# Patient Record
Sex: Male | Born: 1945 | Race: White | Hispanic: No | Marital: Single | Smoking: Former smoker
Health system: Southern US, Community
[De-identification: ages and names within clinical notes are randomized; demographics above are authoritative.]

## PROBLEM LIST (undated history)

## (undated) DIAGNOSIS — Z9289 Personal history of other medical treatment: Secondary | ICD-10-CM

## (undated) DIAGNOSIS — I10 Essential (primary) hypertension: Secondary | ICD-10-CM

## (undated) DIAGNOSIS — E785 Hyperlipidemia, unspecified: Secondary | ICD-10-CM

## (undated) DIAGNOSIS — Q248 Other specified congenital malformations of heart: Secondary | ICD-10-CM

## (undated) DIAGNOSIS — I4821 Permanent atrial fibrillation: Secondary | ICD-10-CM

## (undated) HISTORY — PX: OTHER SURGICAL HISTORY: SHX169

## (undated) HISTORY — DX: Hyperlipidemia, unspecified: E78.5

## (undated) HISTORY — DX: Essential (primary) hypertension: I10

## (undated) HISTORY — DX: Permanent atrial fibrillation: I48.21

## (undated) HISTORY — DX: Personal history of other medical treatment: Z92.89

## (undated) HISTORY — DX: Other specified congenital malformations of heart: Q24.8

---

## 2005-01-26 ENCOUNTER — Observation Stay (HOSPITAL_COMMUNITY): Admission: EM | Admit: 2005-01-26 | Discharge: 2005-01-28 | Payer: Self-pay | Admitting: Emergency Medicine

## 2010-06-03 ENCOUNTER — Ambulatory Visit (INDEPENDENT_AMBULATORY_CARE_PROVIDER_SITE_OTHER): Payer: Medicare Other | Admitting: Cardiovascular Disease

## 2010-06-03 DIAGNOSIS — E7800 Pure hypercholesterolemia, unspecified: Secondary | ICD-10-CM

## 2010-06-03 DIAGNOSIS — I119 Hypertensive heart disease without heart failure: Secondary | ICD-10-CM

## 2010-07-28 ENCOUNTER — Other Ambulatory Visit: Payer: Self-pay | Admitting: *Deleted

## 2010-07-28 DIAGNOSIS — I10 Essential (primary) hypertension: Secondary | ICD-10-CM

## 2010-07-28 MED ORDER — HYDROCHLOROTHIAZIDE 25 MG PO TABS: 25.0000 mg | ORAL_TABLET | Freq: Every day | ORAL | Status: DC

## 2010-07-28 MED ORDER — POTASSIUM CHLORIDE ER 10 MEQ PO TBCR: 10.0000 meq | EXTENDED_RELEASE_TABLET | Freq: Every day | ORAL | Status: DC

## 2010-07-28 NOTE — Telephone Encounter (Signed)
escribe medication per fax request  

## 2010-08-18 ENCOUNTER — Other Ambulatory Visit: Payer: Self-pay | Admitting: *Deleted

## 2010-08-18 DIAGNOSIS — E7800 Pure hypercholesterolemia, unspecified: Secondary | ICD-10-CM

## 2010-08-26 ENCOUNTER — Encounter: Payer: Self-pay | Admitting: Cardiovascular Disease

## 2010-08-26 DIAGNOSIS — I10 Essential (primary) hypertension: Secondary | ICD-10-CM | POA: Insufficient documentation

## 2010-08-26 DIAGNOSIS — Q248 Other specified congenital malformations of heart: Secondary | ICD-10-CM | POA: Insufficient documentation

## 2010-08-26 DIAGNOSIS — E785 Hyperlipidemia, unspecified: Secondary | ICD-10-CM | POA: Insufficient documentation

## 2010-09-01 ENCOUNTER — Other Ambulatory Visit (INDEPENDENT_AMBULATORY_CARE_PROVIDER_SITE_OTHER): Payer: Medicare Other | Admitting: *Deleted

## 2010-09-01 ENCOUNTER — Encounter: Payer: Self-pay | Admitting: Cardiovascular Disease

## 2010-09-01 ENCOUNTER — Ambulatory Visit (INDEPENDENT_AMBULATORY_CARE_PROVIDER_SITE_OTHER): Payer: Medicare Other | Admitting: Cardiovascular Disease

## 2010-09-01 DIAGNOSIS — Z0000 Encounter for general adult medical examination without abnormal findings: Secondary | ICD-10-CM

## 2010-09-01 DIAGNOSIS — E7800 Pure hypercholesterolemia, unspecified: Secondary | ICD-10-CM

## 2010-09-01 DIAGNOSIS — E785 Hyperlipidemia, unspecified: Secondary | ICD-10-CM

## 2010-09-01 DIAGNOSIS — I10 Essential (primary) hypertension: Secondary | ICD-10-CM

## 2010-09-01 NOTE — Assessment & Plan Note (Signed)
He has not tolerated Lipator or Crestor.  Will wait and not try any additional meds at this point.  May be able to take Livalo.  Will check lipid levels again in 6 months.

## 2010-09-01 NOTE — Assessment & Plan Note (Signed)
His BP is fairly well controlled.  His BP is typically well controlled at home and is elevated when he goes to the doctor.  Will continue to watch his salt intake.  Continue to exercise.  Will see in 6 months

## 2010-09-01 NOTE — Progress Notes (Addendum)
Jeffrey Hicks Date of Birth  [birth year] [facility] [address]     [address] [place], [place]  [address] [phone]  Fax  [phone]  History of Present Illness:  Pt is doing well.  No chest pain or dyspnea.  Did not tolerate the Lipator- caused foot pain.  He stopped the lipator and the foot pain has mostly improved.  He still has some discomfort.  [place] in [place], [place].    Current Outpatient Prescriptions on File Prior to Visit  Medication Sig Dispense Refill  . aspirin 81 MG tablet Take 81 mg by mouth daily. 2 DAILY       . hydrochlorothiazide 25 MG tablet Take 1 tablet (25 mg total) by mouth daily.  30 tablet  5  . Multiple Vitamin (MULTIVITAMIN) tablet Take 1 tablet by mouth daily.        . Nebivolol HCl (BYSTOLIC) 20 MG TABS Take by mouth daily.        . potassium chloride (K-DUR) 10 MEQ tablet Take 1 tablet (10 mEq total) by mouth daily.  30 tablet  5  . DISCONTD: atorvastatin (LIPITOR) 20 MG tablet Take 20 mg by mouth daily.          Allergies  Allergen Reactions  . Crestor (Rosuvastatin Calcium)     LEG PAIN  . Lipitor (Atorvastatin Calcium) Other (See Comments)    Joint pain, r/ great toe    Past Medical History  Diagnosis Date  . Left ventricular outflow tract obstruction   . Hyperlipidemia   . Hypertension     No past surgical history on file.  History  Smoking status  . Former Smoker  . Quit date: 08/25/2000  Smokeless tobacco  . Not on file    History  Alcohol Use No    Family History  Problem Relation Age of Onset  . Stroke Mother   . Transient ischemic attack Father     Reviw of Systems:  Reviewed in the HPI.  All other systems are negative.  Physical Exam: BP 140/70  Pulse 64  Wt 205 lb (92.987 kg) The patient is alert and oriented x 3.  The mood and affect are normal.  The skin is warm and dry.  Color is normal.  The HEENT exam reveals that the sclera are  nonicteric.  The mucous membranes are moist.  The carotids are 2+ without bruits.  There is no thyromegaly.  There is no JVD.  The lungs are clear.  The chest wall is non tender.  The heart exam reveals a regular rate with a normal S1 and S2.  There is a soft systolic murmur.  The PMI is not displaced.   Abdominal exam reveals good bowel sounds.  There is no guarding or rebound.  There is no hepatosplenomegaly or tenderness.  There are no masses.  Exam of the legs reveal no clubbing, cyanosis, or edema.  The legs are without rashes.  The distal pulses are intact.  Cranial nerves II - XII are intact.  Motor and sensory functions are intact.  The gait is normal.  ECG: His ECG reveals sinus bradycardia with LVH.  Assessment / Plan:

## 2010-09-24 ENCOUNTER — Other Ambulatory Visit: Payer: Self-pay | Admitting: *Deleted

## 2010-09-24 DIAGNOSIS — I10 Essential (primary) hypertension: Secondary | ICD-10-CM

## 2010-09-24 MED ORDER — POTASSIUM CHLORIDE ER 10 MEQ PO TBCR: 10.0000 meq | EXTENDED_RELEASE_TABLET | Freq: Every day | ORAL | Status: DC

## 2010-09-24 MED ORDER — HYDROCHLOROTHIAZIDE 25 MG PO TABS: 25.0000 mg | ORAL_TABLET | Freq: Every day | ORAL | Status: DC

## 2010-09-24 NOTE — Telephone Encounter (Signed)
Fax received from pharmacy. Refill completed. 90 day fill, Alfonso Ramus RN

## 2011-04-08 ENCOUNTER — Other Ambulatory Visit: Payer: Self-pay | Admitting: *Deleted

## 2011-04-08 DIAGNOSIS — I10 Essential (primary) hypertension: Secondary | ICD-10-CM

## 2011-04-08 MED ORDER — HYDROCHLOROTHIAZIDE 25 MG PO TABS: 25.0000 mg | ORAL_TABLET | Freq: Every day | ORAL | Status: DC

## 2011-05-13 ENCOUNTER — Other Ambulatory Visit: Payer: Self-pay | Admitting: *Deleted

## 2011-05-13 DIAGNOSIS — I10 Essential (primary) hypertension: Secondary | ICD-10-CM

## 2011-05-13 MED ORDER — POTASSIUM CHLORIDE ER 10 MEQ PO TBCR: 10.0000 meq | EXTENDED_RELEASE_TABLET | Freq: Every day | ORAL | Status: DC

## 2011-06-22 ENCOUNTER — Other Ambulatory Visit: Payer: Self-pay | Admitting: *Deleted

## 2011-06-22 ENCOUNTER — Other Ambulatory Visit (HOSPITAL_COMMUNITY): Payer: Self-pay | Admitting: Family Medicine

## 2011-06-22 DIAGNOSIS — I10 Essential (primary) hypertension: Secondary | ICD-10-CM

## 2011-06-22 DIAGNOSIS — E785 Hyperlipidemia, unspecified: Secondary | ICD-10-CM

## 2011-06-22 MED ORDER — NEBIVOLOL HCL 20 MG PO TABS: 20.0000 mg | ORAL_TABLET | Freq: Every day | ORAL | Status: DC

## 2011-06-23 ENCOUNTER — Ambulatory Visit (HOSPITAL_COMMUNITY): Payer: Medicare Other

## 2011-06-23 ENCOUNTER — Other Ambulatory Visit: Payer: Self-pay | Admitting: *Deleted

## 2011-07-22 ENCOUNTER — Ambulatory Visit (INDEPENDENT_AMBULATORY_CARE_PROVIDER_SITE_OTHER): Payer: Medicare Other | Admitting: Cardiovascular Disease

## 2011-07-22 ENCOUNTER — Encounter: Payer: Self-pay | Admitting: Cardiovascular Disease

## 2011-07-22 VITALS — BP 158/76 | HR 60 | Ht 70.0 in | Wt 208.1 lb

## 2011-07-22 DIAGNOSIS — I10 Essential (primary) hypertension: Secondary | ICD-10-CM

## 2011-07-22 DIAGNOSIS — E785 Hyperlipidemia, unspecified: Secondary | ICD-10-CM

## 2011-07-22 MED ORDER — LISINOPRIL 10 MG PO TABS: 10.0000 mg | ORAL_TABLET | Freq: Every day | ORAL | Status: DC

## 2011-07-22 NOTE — Assessment & Plan Note (Signed)
He still has an elevated blood pressure. He has not tolerated atorvastatin or Crestor. We will consider starting him on Vytorin in the near future. We'll check fasting labs on him in 3 months.

## 2011-07-22 NOTE — Patient Instructions (Addendum)
Your physician recommends that you schedule a follow-up appointment in:3 MONTHS   Your physician recommends that you return for a FASTING lipid profile: 3 MONTHS   Your physician has recommended you make the following change in your medication  START LISINOPRIL 10 MG DAILY  DASH Diet The DASH diet stands for "Dietary Approaches to Stop Hypertension." It is a healthy eating plan that has been shown to reduce high blood pressure (hypertension) in as little as 14 days, while also possibly providing other significant health benefits. These other health benefits include reducing the risk of breast cancer after menopause and reducing the risk of type 2 diabetes, heart disease, colon cancer, and stroke. Health benefits also include weight loss and slowing kidney failure in patients with chronic kidney disease.  DIET GUIDELINES  Limit salt (sodium). Your diet should contain less than 1500 mg of sodium daily.   Limit refined or processed carbohydrates. Your diet should include mostly whole grains. Desserts and added sugars should be used sparingly.   Include small amounts of heart-healthy fats. These types of fats include nuts, oils, and tub margarine. Limit saturated and trans fats. These fats have been shown to be harmful in the body.  CHOOSING FOODS  The following food groups are based on a 2000 calorie diet. See your Registered Dietitian for individual calorie needs. Grains and Grain Products (6 to 8 servings daily)  Eat More Often: Whole-wheat bread, brown rice, whole-grain or wheat pasta, quinoa, popcorn without added fat or salt (air popped).   Eat Less Often: White bread, white pasta, white rice, cornbread.  Vegetables (4 to 5 servings daily)  Eat More Often: Fresh, frozen, and canned vegetables. Vegetables may be raw, steamed, roasted, or grilled with a minimal amount of fat.   Eat Less Often/Avoid: Creamed or fried vegetables. Vegetables in a cheese sauce.  Fruit (4 to 5 servings  daily)  Eat More Often: All fresh, canned (in natural juice), or frozen fruits. Dried fruits without added sugar. One hundred percent fruit juice ( cup [237 mL] daily).   Eat Less Often: Dried fruits with added sugar. Canned fruit in light or heavy syrup.  [employer]ocker, Fish, and Poultry (2 servings or less daily. One serving is 3 to 4 oz [85-114 g]).  Eat More Often: Ninety percent or leaner ground beef, tenderloin, sirloin. Round cuts of beef, chicken breast, [place] breast. All fish. Grill, bake, or broil your meat. Nothing should be fried.   Eat Less Often/Avoid: Fatty cuts of meat, [place], or chicken leg, thigh, or wing. Fried cuts of meat or fish.  Dairy (2 to 3 servings)  Eat More Often: Low-fat or fat-free milk, low-fat plain or light yogurt, reduced-fat or part-skim cheese.   Eat Less Often/Avoid: Milk (whole, 2%, skim, or chocolate).Whole milk yogurt. Full-fat cheeses.  Nuts, Seeds, and Legumes (4 to 5 servings per week)  Eat More Often: All without added salt.   Eat Less Often/Avoid: Salted nuts and seeds, canned beans with added salt.  Fats and Sweets (limited)  Eat More Often: Vegetable oils, tub margarines without trans fats, sugar-free gelatin. Mayonnaise and salad dressings.   Eat Less Often/Avoid: Coconut oils, palm oils, butter, stick margarine, cream, half and half, cookies, candy, pie.  FOR MORE INFORMATION The Dash Diet Eating Plan: www.dashdiet.org Document Released: 03/26/2011 Document Reviewed: 03/16/2011 [facility] Patient Information 2012 [place], [place].

## 2011-07-22 NOTE — Progress Notes (Signed)
Jeffrey Hicks Date of Birth  1945/10/05 [facility] / [facility] [address]     [address] [place], [place]  [address] [phone]  Fax  [phone]  History of Present Illness:  Pt is doing well.  No chest pain or dyspnea.  Did not tolerate the Lipator- caused foot pain.  He stopped the lipator and the foot pain has mostly improved.  He still has some discomfort.  He has been up to his mountain house in [place], [place].    Current Outpatient Prescriptions on File Prior to Visit  Medication Sig Dispense Refill  . aspirin 81 MG tablet Take 81 mg by mouth daily. 2 DAILY       . hydrochlorothiazide (HYDRODIURIL) 25 MG tablet Take 1 tablet (25 mg total) by mouth daily.  90 tablet  1  . Multiple Vitamin (MULTIVITAMIN) tablet Take 1 tablet by mouth daily.        . Nebivolol HCl (BYSTOLIC) 20 MG TABS Take 1 tablet (20 mg total) by mouth daily.  30 tablet  6  . potassium chloride (K-DUR) 10 MEQ tablet Take 1 tablet (10 mEq total) by mouth daily.  90 tablet  2    Allergies  Allergen Reactions  . Crestor (Rosuvastatin Calcium)     LEG PAIN  . Lipitor (Atorvastatin Calcium) Other (See Comments)    Joint pain, r/ great toe    Past Medical History  Diagnosis Date  . Left ventricular outflow tract obstruction   . Hyperlipidemia   . Hypertension     No past surgical history on file.  History  Smoking status  . Former Smoker  . Quit date: 08/25/2000  Smokeless tobacco  . Not on file    History  Alcohol Use No    Family History  Problem Relation Age of Onset  . Stroke Mother   . Transient ischemic attack Father     Reviw of Systems:  Reviewed in the HPI.  All other systems are negative.  Physical Exam: BP 158/76  Pulse 60  Ht 5\' 10"  (1.778 m)  Wt 208 lb 1.9 oz (94.403 kg)  BMI 29.86 kg/m2 The patient is alert and oriented x 3.  The mood and affect are normal.  The skin is warm and dry.  Color is normal.  The HEENT exam reveals that the  sclera are nonicteric.  The mucous membranes are moist.  The carotids are 2+ without bruits.  There is no thyromegaly.  There is no JVD.  The lungs are clear.  The chest wall is non tender.  The heart exam reveals a regular rate with a normal S1 and S2.  There is a 2/6  systolic murmur.  The PMI is not displaced.   Abdominal exam reveals good bowel sounds.  There is no guarding or rebound.  There is no hepatosplenomegaly or tenderness.  There are no masses.  Exam of the legs reveal no clubbing, cyanosis, or edema.  The legs are without rashes.  The distal pulses are intact.  Cranial nerves II - XII are intact.  Motor and sensory functions are intact.  The gait is normal.  ECG: 07/22/2011. Normal sinus rhythm. He has nonspecific ST and T-wave adenopathies.  Assessment / Plan:

## 2011-07-22 NOTE — Assessment & Plan Note (Signed)
Jeffrey Hicks's blood pressure still elevated. We will add lisinopril 10 mg a day to his medical regimen. I've asked him to try to watch his salt intake.

## 2011-10-14 ENCOUNTER — Ambulatory Visit: Payer: Medicare Other | Admitting: Cardiovascular Disease

## 2011-10-15 ENCOUNTER — Ambulatory Visit (INDEPENDENT_AMBULATORY_CARE_PROVIDER_SITE_OTHER): Payer: Medicare Other | Admitting: Cardiovascular Disease

## 2011-10-15 ENCOUNTER — Encounter: Payer: Self-pay | Admitting: Cardiovascular Disease

## 2011-10-15 VITALS — BP 126/70 | HR 50 | Ht 70.0 in | Wt 194.0 lb

## 2011-10-15 DIAGNOSIS — I10 Essential (primary) hypertension: Secondary | ICD-10-CM

## 2011-10-15 DIAGNOSIS — E785 Hyperlipidemia, unspecified: Secondary | ICD-10-CM

## 2011-10-15 LAB — HEPATIC FUNCTION PANEL
ALT: 20 U/L (ref 0–53)
Albumin: 3.9 g/dL (ref 3.5–5.2)
Alkaline Phosphatase: 46 U/L (ref 39–117)
Bilirubin, Direct: 0.1 mg/dL (ref 0.0–0.3)
Total Bilirubin: 0.8 mg/dL (ref 0.3–1.2)
Total Protein: 6.8 g/dL (ref 6.0–8.3)

## 2011-10-15 LAB — BASIC METABOLIC PANEL
BUN: 18 mg/dL (ref 6–23)
CO2: 29 mEq/L (ref 19–32)
Calcium: 9.4 mg/dL (ref 8.4–10.5)
Creatinine, Ser: 1.2 mg/dL (ref 0.4–1.5)
GFR: 64.24 mL/min (ref >60.00)
Glucose, Bld: 104 mg/dL — ABNORMAL HIGH (ref 70–99)
Potassium: 4.3 mEq/L (ref 3.5–5.1)
Sodium: 140 mEq/L (ref 135–145)

## 2011-10-15 LAB — LIPID PANEL
Cholesterol: 223 mg/dL — ABNORMAL HIGH (ref 0–200)
HDL: 67.4 mg/dL (ref >39.00)
Total CHOL/HDL Ratio: 3
Triglycerides: 67 mg/dL (ref 0.0–149.0)

## 2011-10-15 NOTE — Progress Notes (Signed)
    [name] Date of Birth  [birth year]       [facility] Office [address]  [address] [place], [place]  [address]   [place], [place]  [address] [phone]     [phone]   Fax  [phone]    Fax [phone]  Problem List: 1. Hyperlipidemia 2. Hypertension   History of Present Illness: Pt is doing well. No chest pain or dyspnea. Did not tolerate the Lipator- caused foot pain. He stopped the lipator and the foot pain has mostly improved. He still has some discomfort.  He has been up to his mountain house in [place], [place].   He's been doing lots of yard work. He has not had episodes of chest pain or shortness of breath.  Current Outpatient Prescriptions on File Prior to Visit  Medication Sig Dispense Refill  . aspirin 81 MG tablet Take 81 mg by mouth daily. 2 Tablets      . hydrochlorothiazide (HYDRODIURIL) 25 MG tablet Take 1 tablet (25 mg total) by mouth daily.  90 tablet  1  . lisinopril (PRINIVIL,ZESTRIL) 10 MG tablet Take 1 tablet (10 mg total) by mouth daily.  30 tablet  5  . Multiple Vitamin (MULTIVITAMIN) tablet Take 1 tablet by mouth daily.        . Nebivolol HCl (BYSTOLIC) 20 MG TABS Take 1 tablet (20 mg total) by mouth daily.  30 tablet  6  . potassium chloride (K-DUR) 10 MEQ tablet Take 1 tablet (10 mEq total) by mouth daily.  90 tablet  2    Allergies  Allergen Reactions  . Crestor (Rosuvastatin Calcium)     LEG PAIN  . Lipitor (Atorvastatin Calcium) Other (See Comments)    Joint pain, r/ great toe    Past Medical History  Diagnosis Date  . Left ventricular outflow tract obstruction   . Hyperlipidemia   . Hypertension     No past surgical history on file.  History  Smoking status  . Former Smoker  . Quit date: 08/25/2000  Smokeless tobacco  . Not on file    History  Alcohol Use No    Family History  Problem Relation Age of Onset  . Stroke Mother   . Transient ischemic attack  Father     Reviw of Systems:  Reviewed in the HPI.  All other systems are negative.  Physical Exam: Blood pressure 126/70, pulse 50, height 5\' 10"  (1.778 m), weight 194 lb (87.998 kg). General: Well developed, well nourished, in no acute distress.  Head: Normocephalic, atraumatic, sclera non-icteric, mucus membranes are moist,   Neck: Supple. Carotids are 2 + without bruits. No JVD  Lungs: Clear bilaterally to auscultation.  Heart: regular rate.  normal  S1 S2. No murmurs, gallops or rubs.  Abdomen: Soft, non-tender, non-distended with normal bowel sounds. No hepatomegaly. No rebound/guarding. No masses.  Msk:  Strength and tone are normal  Extremities: No clubbing or cyanosis. No edema.  Distal pedal pulses are 2+ and equal bilaterally.  Neuro: Alert and oriented X 3. Moves all extremities spontaneously.  Psych:  Responds to questions appropriately with a normal affect.  ECG:  Assessment / Plan:

## 2011-10-15 NOTE — Assessment & Plan Note (Signed)
He has had significant leg pain on atorvastatin. He has already stopped it. He checked his labs today and his LDL is mildly elevated.  At this point I'm not sure that we need to have any additional medications. We'll see him back in 6 months. We'll check fasting lipids at that time.

## 2011-10-15 NOTE — Patient Instructions (Addendum)
Your physician recommends that you return for lab work in: today and in 6 months   Your physician wants you to follow-up in: 6 months  You will receive a reminder letter in the mail two months in advance. If you don't receive a letter, please call our office to schedule the follow-up appointment.

## 2011-10-15 NOTE — Assessment & Plan Note (Signed)
His blood pressures been well-controlled. We'll continue the same medications.

## 2011-12-28 ENCOUNTER — Other Ambulatory Visit: Payer: Self-pay | Admitting: *Deleted

## 2011-12-28 MED ORDER — LISINOPRIL 10 MG PO TABS: 10.0000 mg | ORAL_TABLET | Freq: Every day | ORAL | Status: DC

## 2011-12-28 NOTE — Telephone Encounter (Signed)
Fax Received. Refill Completed. Jeffrey Hicks (R.M.A)   

## 2012-02-19 ENCOUNTER — Other Ambulatory Visit: Payer: Self-pay | Admitting: *Deleted

## 2012-02-19 ENCOUNTER — Other Ambulatory Visit: Payer: Self-pay | Admitting: Cardiovascular Disease

## 2012-02-19 DIAGNOSIS — I10 Essential (primary) hypertension: Secondary | ICD-10-CM

## 2012-02-19 MED ORDER — HYDROCHLOROTHIAZIDE 25 MG PO TABS: 25.0000 mg | ORAL_TABLET | Freq: Every day | ORAL | Status: DC

## 2012-02-19 MED ORDER — POTASSIUM CHLORIDE ER 10 MEQ PO TBCR: 10.0000 meq | EXTENDED_RELEASE_TABLET | Freq: Every day | ORAL | Status: DC

## 2012-02-19 NOTE — Telephone Encounter (Signed)
Fax Received. Refill Completed. Deeandra Jerry Chowoe (R.M.A)   

## 2012-02-19 NOTE — Telephone Encounter (Signed)
Fax Received. Refill Completed. Jeffrey Hicks (R.M.A)   

## 2012-03-30 ENCOUNTER — Other Ambulatory Visit: Payer: Self-pay | Admitting: *Deleted

## 2012-03-30 DIAGNOSIS — I10 Essential (primary) hypertension: Secondary | ICD-10-CM

## 2012-03-30 DIAGNOSIS — E785 Hyperlipidemia, unspecified: Secondary | ICD-10-CM

## 2012-03-31 ENCOUNTER — Encounter: Payer: Self-pay | Admitting: Cardiovascular Disease

## 2012-03-31 ENCOUNTER — Ambulatory Visit (INDEPENDENT_AMBULATORY_CARE_PROVIDER_SITE_OTHER): Payer: Medicare Other | Admitting: Cardiovascular Disease

## 2012-03-31 ENCOUNTER — Other Ambulatory Visit (INDEPENDENT_AMBULATORY_CARE_PROVIDER_SITE_OTHER): Payer: Medicare Other

## 2012-03-31 VITALS — BP 168/80 | HR 55 | Ht 70.0 in | Wt 184.1 lb

## 2012-03-31 DIAGNOSIS — E785 Hyperlipidemia, unspecified: Secondary | ICD-10-CM

## 2012-03-31 DIAGNOSIS — I10 Essential (primary) hypertension: Secondary | ICD-10-CM

## 2012-03-31 LAB — BASIC METABOLIC PANEL
BUN: 21 mg/dL (ref 6–23)
CO2: 29 mEq/L (ref 19–32)
Calcium: 9 mg/dL (ref 8.4–10.5)
Chloride: 100 mEq/L (ref 96–112)
Creatinine, Ser: 1.2 mg/dL (ref 0.4–1.5)
Glucose, Bld: 110 mg/dL — ABNORMAL HIGH (ref 70–99)
Potassium: 4.2 mEq/L (ref 3.5–5.1)

## 2012-03-31 LAB — LIPID PANEL
Cholesterol: 187 mg/dL (ref 0–200)
HDL: 64 mg/dL (ref >39.00)
LDL Cholesterol: 112 mg/dL — ABNORMAL HIGH (ref 0–99)
Total CHOL/HDL Ratio: 3
Triglycerides: 54 mg/dL (ref 0.0–149.0)
VLDL: 10.8 mg/dL (ref 0.0–40.0)

## 2012-03-31 MED ORDER — POTASSIUM CHLORIDE ER 10 MEQ PO TBCR: 10.0000 meq | EXTENDED_RELEASE_TABLET | Freq: Every day | ORAL | Status: DC

## 2012-03-31 MED ORDER — HYDROCHLOROTHIAZIDE 25 MG PO TABS: 25.0000 mg | ORAL_TABLET | Freq: Every day | ORAL | Status: DC

## 2012-03-31 MED ORDER — LISINOPRIL 10 MG PO TABS: 10.0000 mg | ORAL_TABLET | Freq: Every day | ORAL | Status: DC

## 2012-03-31 MED ORDER — NEBIVOLOL HCL 20 MG PO TABS: 20.0000 mg | ORAL_TABLET | Freq: Every day | ORAL | Status: DC

## 2012-03-31 NOTE — Progress Notes (Signed)
    Jeffrey Hicks Date of Birth  1946-01-18       [facility] Office [address]  [address] [place], [place]  [address]   [place], [place]  [address] [phone]     [phone]   Fax  [phone]    Fax [phone]  Problem List: 1. Hyperlipidemia 2. Hypertension   History of Present Illness: Pt is doing well. No chest pain or dyspnea. Did not tolerate the Lipator- caused foot pain. He stopped the lipator and the foot pain has mostly improved. He still has some discomfort.  He has been up to his mountain house in [place], [place].   He's been doing lots of yard work. He has not had episodes of chest pain or shortness of breath.  His BP is up a bit today.  His blood pressure readings at home have been normal.  Current Outpatient Prescriptions on File Prior to Visit  Medication Sig Dispense Refill  . aspirin 81 MG tablet Take 81 mg by mouth daily. 2 Tablets      . hydrochlorothiazide (HYDRODIURIL) 25 MG tablet Take 1 tablet (25 mg total) by mouth daily.  90 tablet  1  . lisinopril (PRINIVIL,ZESTRIL) 10 MG tablet Take 1 tablet (10 mg total) by mouth daily.  30 tablet  5  . Multiple Vitamin (MULTIVITAMIN) tablet Take 1 tablet by mouth daily.        . Nebivolol HCl (BYSTOLIC) 20 MG TABS Take 1 tablet (20 mg total) by mouth daily.  30 tablet  6  . potassium chloride (K-DUR) 10 MEQ tablet Take 1 tablet (10 mEq total) by mouth daily.  90 tablet  2    Allergies  Allergen Reactions  . Crestor (Rosuvastatin Calcium)     LEG PAIN  . Lipitor (Atorvastatin Calcium) Other (See Comments)    Joint pain, r/ great toe    Past Medical History  Diagnosis Date  . Left ventricular outflow tract obstruction   . Hyperlipidemia   . Hypertension     No past surgical history on file.  History  Smoking status  . Former Smoker  . Quit date: 08/25/2000  Smokeless tobacco  . Not on file    History  Alcohol Use No    Family History   Problem Relation Age of Onset  . Stroke Mother   . Transient ischemic attack Father     Reviw of Systems:  Reviewed in the HPI.  All other systems are negative.  Physical Exam: Blood pressure 168/80, pulse 55, height 5\' 10"  (1.778 m), weight 184 lb 1.9 oz (83.516 kg), SpO2 98.00%. General: Well developed, well nourished, in no acute distress.  Head: Normocephalic, atraumatic, sclera non-icteric, mucus membranes are moist,   Neck: Supple. Carotids are 2 + without bruits. No JVD  Lungs: Clear bilaterally to auscultation.  Heart: regular rate.  normal  S1 S2. No murmurs, gallops or rubs.  Abdomen: Soft, non-tender, non-distended with normal bowel sounds. No hepatomegaly. No rebound/guarding. No masses.  Msk:  Strength and tone are normal  Extremities: No clubbing or cyanosis. No edema.  Distal pedal pulses are 2+ and equal bilaterally.  Neuro: Alert and oriented X 3. Moves all extremities spontaneously.  Psych:  Responds to questions appropriately with a normal affect.  ECG:  Assessment / Plan:

## 2012-03-31 NOTE — Patient Instructions (Addendum)
Your physician wants you to follow-up in: 6 months  You will receive a reminder letter in the mail two months in advance. If you don't receive a letter, please call our office to schedule the follow-up appointment.  Your physician recommends that you continue on your current medications as directed. Please refer to the Current Medication list given to you today.  

## 2012-03-31 NOTE — Assessment & Plan Note (Signed)
Jeffrey Hicks  presents today for further evaluation of his hypertension. His blood pressure is a little high today. It was normal during his last visit and he states that it is normal at home. We will continue with his same medications. He'll continue to exercise on a regular basis. I've instructed him to watch his salt.  He will bring his  blood pressure log with him during his next visit

## 2012-03-31 NOTE — Assessment & Plan Note (Signed)
His cholesterol levels have been a little bit high. His triglyceride levels are well controlled. He is intolerant to statins. We drew repeat fasting labs today.  I seen again in 6 months for followup office visit, fasting labs and EKG.

## 2012-07-27 ENCOUNTER — Other Ambulatory Visit: Payer: Self-pay | Admitting: *Deleted

## 2012-07-27 MED ORDER — LISINOPRIL 10 MG PO TABS: 10.0000 mg | ORAL_TABLET | Freq: Every day | ORAL | Status: DC

## 2012-11-03 ENCOUNTER — Ambulatory Visit (INDEPENDENT_AMBULATORY_CARE_PROVIDER_SITE_OTHER): Payer: Medicare Other | Admitting: Cardiovascular Disease

## 2012-11-03 ENCOUNTER — Encounter: Payer: Self-pay | Admitting: Cardiovascular Disease

## 2012-11-03 VITALS — BP 160/76 | HR 49 | Ht 70.0 in | Wt 185.8 lb

## 2012-11-03 DIAGNOSIS — I10 Essential (primary) hypertension: Secondary | ICD-10-CM

## 2012-11-03 DIAGNOSIS — E785 Hyperlipidemia, unspecified: Secondary | ICD-10-CM

## 2012-11-03 NOTE — Progress Notes (Signed)
Jeffrey Hicks Date of Birth  1945-06-04       [facility] Office [address]  [address] [place], [place]  [address]   [place], [place]  [address] [phone]     [phone]   Fax  [phone]    Fax [phone]  Problem List: 1. Hyperlipidemia 2. Hypertension   History of Present Illness: Jeffrey Hicks  is doing well. No chest pain or dyspnea. Did not tolerate the Lipator- caused foot pain. He stopped the lipator and the foot pain has mostly improved. He still has some discomfort.  He has been up to his mountain house in [place], [place].   He's been doing lots of yard work. He has not had episodes of chest pain or shortness of breath.  His BP is up a bit today.  His blood pressure readings at home have been normal.  November 03, 2012:  Jeffrey Hicks has been doing well.  He has spent lots of time in [place] .    He has been taking his BP regularly and his readings are in the 105 / 70 range.    His readings are always elevated here in the office.   Current Outpatient Prescriptions on File Prior to Visit  Medication Sig Dispense Refill  . aspirin 81 MG tablet Take 81 mg by mouth daily. 2 Tablets      . hydrochlorothiazide (HYDRODIURIL) 25 MG tablet Take 1 tablet (25 mg total) by mouth daily.  90 tablet  3  . lisinopril (PRINIVIL,ZESTRIL) 10 MG tablet Take 1 tablet (10 mg total) by mouth daily.  30 tablet  3  . Multiple Vitamin (MULTIVITAMIN) tablet Take 1 tablet by mouth daily.        . Nebivolol HCl (BYSTOLIC) 20 MG TABS Take 1 tablet (20 mg total) by mouth daily.  30 tablet  11  . potassium chloride (K-DUR) 10 MEQ tablet Take 1 tablet (10 mEq total) by mouth daily.  90 tablet  3   No current facility-administered medications on file prior to visit.    Allergies  Allergen Reactions  . Crestor (Rosuvastatin Calcium)     LEG PAIN  . Lipitor (Atorvastatin Calcium) Other (See Comments)    Joint pain, r/ great toe    Past Medical  History  Diagnosis Date  . Left ventricular outflow tract obstruction   . Hyperlipidemia   . Hypertension     No past surgical history on file.  History  Smoking status  . Former Smoker  . Quit date: 08/25/2000  Smokeless tobacco  . Not on file    History  Alcohol Use No    Family History  Problem Relation Age of Onset  . Stroke Mother   . Transient ischemic attack Father     Reviw of Systems:  Reviewed in the HPI.  All other systems are negative.  Physical Exam: Blood pressure 160/76, pulse 49, height 5\' 10"  (1.778 m), weight 185 lb 12.8 oz (84.278 kg). General: Well developed, well nourished, in no acute distress.  Head: Normocephalic, atraumatic, sclera non-icteric, mucus membranes are moist,   Neck: Supple. Carotids are 2 + without bruits. No JVD  Lungs: Clear bilaterally to auscultation.  Heart: regular rate.  normal  S1 S2. No murmurs, gallops or rubs.  Abdomen: Soft, non-tender, non-distended with normal bowel sounds. No hepatomegaly. No rebound/guarding. No masses.  Msk:  Strength and tone are normal  Extremities: No clubbing or cyanosis. No  edema.  Distal pedal pulses are 2+ and equal bilaterally.  Neuro: Alert and oriented X 3. Moves all extremities spontaneously.  Psych:  Responds to questions appropriately with a normal affect.  ECG: November 03, 2012:  Marked sinus bradycardia at 49.  Voltage for LVH.  Assessment / Plan:

## 2012-11-03 NOTE — Patient Instructions (Addendum)
Your physician wants you to follow-up in: 6 month You will receive a reminder letter in the mail two months in advance. If you don't receive a letter, please call our office to schedule the follow-up appointment.  Your physician recommends that you return for a FASTING lipid profile: 6 months   Your physician recommends that you continue on your current medications as directed. Please refer to the Current Medication list given to you today.

## 2012-11-03 NOTE — Assessment & Plan Note (Addendum)
Jeffrey Hicks is doing well.  His BP readings at home are always quite good.  The BP seems to be elevated when he comes to the office.  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   We will see him in 6 months.   He will bring his BP cuff with him so that we can calibrate it with ours

## 2013-04-04 ENCOUNTER — Other Ambulatory Visit: Payer: Self-pay

## 2013-04-04 MED ORDER — LISINOPRIL 10 MG PO TABS: 10.0000 mg | ORAL_TABLET | Freq: Every day | ORAL | Status: DC

## 2013-04-19 ENCOUNTER — Other Ambulatory Visit: Payer: Self-pay

## 2013-04-19 MED ORDER — NEBIVOLOL HCL 20 MG PO TABS: 20.0000 mg | ORAL_TABLET | Freq: Every day | ORAL | Status: DC

## 2013-04-21 ENCOUNTER — Other Ambulatory Visit: Payer: Self-pay

## 2013-04-21 MED ORDER — NEBIVOLOL HCL 20 MG PO TABS: 20.0000 mg | ORAL_TABLET | Freq: Every day | ORAL | Status: DC

## 2013-05-09 ENCOUNTER — Encounter: Payer: Self-pay | Admitting: Cardiovascular Disease

## 2013-05-09 ENCOUNTER — Ambulatory Visit (INDEPENDENT_AMBULATORY_CARE_PROVIDER_SITE_OTHER): Payer: Medicare Other | Admitting: Cardiovascular Disease

## 2013-05-09 VITALS — BP 155/67 | HR 52 | Ht 70.0 in | Wt 193.0 lb

## 2013-05-09 DIAGNOSIS — I10 Essential (primary) hypertension: Secondary | ICD-10-CM

## 2013-05-09 NOTE — Patient Instructions (Signed)
Your physician wants you to follow-up in: 6 months  You will receive a reminder letter in the mail two months in advance. If you don't receive a letter, please call our office to schedule the follow-up appointment.  Your physician recommends that you continue on your current medications as directed. Please refer to the Current Medication list given to you today.  

## 2013-05-09 NOTE — Progress Notes (Signed)
Jeffrey Hicks Date of Birth  04/16/1946       [facility]    [facility] [address], [address]  [address], [address] [place]  [address]   [place], [place]  [phone]     [phone]   Fax  [phone]    Fax [phone]  Problem List: 1. Hyperlipidemia 2. Hypertension   History of Present Illness: Jeffrey Hicks  is doing well. No chest pain or dyspnea. Did not tolerate the Lipator- caused foot pain. He stopped the lipator and the foot pain has mostly improved. He still has some discomfort.  [place], [place]   He's been doing lots of yard work. He has not had episodes of chest pain or shortness of breath.  His BP is up a bit today.  His blood pressure readings at home have been normal.  November 03, 2012:  Jeffrey Hicks has been doing well.  He has spent lots of time in [place] .    He has been taking his BP regularly and his readings are in the 105 / 70 range.    His readings are always elevated here in the office.   Jan. 20, 2015  Jeffrey Hicks is doing well.   He records his BP regularly at home and his readings are always normal.  He is exercising - walks 2-3 miles a day.   [place]    His lipids were drawn by his medical doctor. His total cholesterol is 222. His LDL is 139. The triglyceride level is  ow.  Current Outpatient Prescriptions on File Prior to Visit  Medication Sig Dispense Refill  . aspirin 81 MG tablet Take 81 mg by mouth daily. 2 Tablets      . lisinopril (PRINIVIL,ZESTRIL) 10 MG tablet Take 1 tablet (10 mg total) by mouth daily.  30 tablet  3  . Multiple Vitamin (MULTIVITAMIN) tablet Take 1 tablet by mouth daily.        . Nebivolol HCl (BYSTOLIC) 20 MG TABS Take 1 tablet (20 mg total) by mouth daily.  30 tablet  6  . Omega-3 Fatty Acids (FISH OIL PO) Take by mouth daily.      . hydrochlorothiazide (HYDRODIURIL) 25 MG tablet Take 1 tablet (25 mg total) by mouth daily.  90 tablet  3  .  potassium chloride (K-DUR) 10 MEQ tablet Take 1 tablet (10 mEq total) by mouth daily.  90 tablet  3   No current facility-administered medications on file prior to visit.    Allergies  Allergen Reactions  . Crestor [Rosuvastatin Calcium]     LEG PAIN  . Lipitor [Atorvastatin Calcium] Other (See Comments)    Joint pain, r/ great toe    Past Medical History  Diagnosis Date  . Left ventricular outflow tract obstruction   . Hyperlipidemia   . Hypertension     No past surgical history on file.  History  Smoking status  . Former Smoker  . Quit date: 08/25/2000  Smokeless tobacco  . Not on file    History  Alcohol Use No    Family History  Problem Relation Age of Onset  . Stroke Mother   . Transient ischemic attack Father     Reviw of Systems:  Reviewed in the HPI.  All other systems are negative.  Physical Exam: Blood pressure 155/67, pulse 52, height 5\' 10"  (1.778 m), weight 193 lb (87.544 kg). General:  Well developed, well nourished, in no acute distress.  Head: Normocephalic, atraumatic, sclera non-icteric, mucus membranes are moist,   Neck: Supple. Carotids are 2 + without bruits. No JVD  Lungs: Clear bilaterally to auscultation.  Heart: regular rate.  normal  S1 S2. No murmurs, gallops or rubs.  Abdomen: Soft, non-tender, non-distended with normal bowel sounds. No hepatomegaly. No rebound/guarding. No masses.  Msk:  Strength and tone are normal  Extremities: No clubbing or cyanosis. No edema.  Distal pedal pulses are 2+ and equal bilaterally.  Neuro: Alert and oriented X 3. Moves all extremities spontaneously.  Psych:  Responds to questions appropriately with a normal affect.  ECG: November 03, 2012:  Marked sinus bradycardia at 48.  Voltage for LVH.  Assessment / Plan:

## 2013-05-09 NOTE — Assessment & Plan Note (Signed)
Jeffrey MarshallJimmy  is doing fine. His blood pressures little bit elevated here today but his blood pressure at home are in the normal range.  I have reminded him to keep a BP log and to bring his readings in with him at his next visit.  He remains active.  Does not eat salt.   Will see him in 6 months.

## 2013-05-29 ENCOUNTER — Other Ambulatory Visit: Payer: Self-pay

## 2013-05-29 DIAGNOSIS — I10 Essential (primary) hypertension: Secondary | ICD-10-CM

## 2013-05-29 MED ORDER — HYDROCHLOROTHIAZIDE 25 MG PO TABS
25.0000 mg | ORAL_TABLET | Freq: Every day | ORAL | Status: DC
Start: 2013-05-29 — End: 2014-07-03

## 2013-05-29 MED ORDER — POTASSIUM CHLORIDE ER 10 MEQ PO TBCR: 10.0000 meq | EXTENDED_RELEASE_TABLET | Freq: Every day | ORAL | Status: DC

## 2013-07-07 ENCOUNTER — Other Ambulatory Visit: Payer: Self-pay | Admitting: *Deleted

## 2013-07-07 MED ORDER — LISINOPRIL 10 MG PO TABS: 10.0000 mg | ORAL_TABLET | Freq: Every day | ORAL | Status: DC

## 2013-10-18 DEATH — deceased

## 2013-11-10 ENCOUNTER — Other Ambulatory Visit: Payer: Self-pay

## 2013-11-10 MED ORDER — LISINOPRIL 10 MG PO TABS: 10.0000 mg | ORAL_TABLET | Freq: Every day | ORAL | Status: DC

## 2014-03-02 ENCOUNTER — Other Ambulatory Visit: Payer: Self-pay | Admitting: *Deleted

## 2014-03-02 MED ORDER — LISINOPRIL 10 MG PO TABS: 10.0000 mg | ORAL_TABLET | Freq: Every day | ORAL | Status: DC

## 2014-03-27 ENCOUNTER — Encounter: Payer: Self-pay | Admitting: Cardiovascular Disease

## 2014-03-27 ENCOUNTER — Ambulatory Visit (INDEPENDENT_AMBULATORY_CARE_PROVIDER_SITE_OTHER): Payer: Medicare Other | Admitting: Cardiovascular Disease

## 2014-03-27 VITALS — BP 122/58 | HR 54 | Ht 70.0 in | Wt 193.0 lb

## 2014-03-27 DIAGNOSIS — E785 Hyperlipidemia, unspecified: Secondary | ICD-10-CM

## 2014-03-27 DIAGNOSIS — I10 Essential (primary) hypertension: Secondary | ICD-10-CM

## 2014-03-27 MED ORDER — CARVEDILOL 25 MG PO TABS: 25.0000 mg | ORAL_TABLET | Freq: Two times a day (BID) | ORAL | Status: DC

## 2014-03-27 NOTE — Assessment & Plan Note (Signed)
His blood pressure readings are well controlled.  His insurance currently has stopped paying for Bystolic 20 mg a day. They will only give him 10 mg a day.  I dont think this will be enough. Will change him to Coreg 25 BID.  I  l'll see him in 6 months.  Sooner if we need to address his BP

## 2014-03-27 NOTE — Patient Instructions (Signed)
Your physician has recommended you make the following change in your medication:  STOP Bystolic START Carvedilol (Coreg) 25 mg twice daily - take 12 hours apart  Your physician wants you to follow-up in: 6 months with Dr. Elease HashimotoNahser.  You will receive a reminder letter in the mail two months in advance. If you don't receive a letter, please call our office to schedule the follow-up appointment.

## 2014-03-27 NOTE — Progress Notes (Signed)
Wynn MaudlinJames L Mannella Date of Birth  02/17/1946       [facility]    [facility] [address]  [address] [place], [place]  [address]   [place], [place]  [address] [phone]     [phone]   Fax  [phone]    Fax [phone]  Problem List: 1. Hyperlipidemia 2. Hypertension   History of Present Illness: [name]  is doing well. No chest pain or dyspnea. Did not tolerate the Lipator- caused foot pain. He stopped the lipator and the foot pain has mostly improved. He still has some discomfort.  [place]   He's been doing lots of yard work. He has not had episodes of chest pain or shortness of breath.  His BP is up a bit today.  His blood pressure readings at home have been normal.  November 03, 2012:  [name] has been doing well.  He has spent lots of time in [place] .    He has been taking his BP regularly and his readings are in the 105 / 70 range.    His readings are always elevated here in the office.   Jan. 20, 2015  [name] is doing well.   He records his BP regularly at home and his readings are always normal.  He is exercising - walks 2-3 miles a day.   [place] farm.    His lipids were drawn by his medical doctor. His total cholesterol is 222. His LDL is 139. The triglyceride level is  Ow.  Dec. 8, 2015:  [name] is doing well.  No CP or dyspnea. [place]  [place]   Current Outpatient Prescriptions on File Prior to Visit  Medication Sig Dispense Refill  . aspirin 81 MG tablet Take 81 mg by mouth daily. 2 Tablets    . hydrochlorothiazide (HYDRODIURIL) 25 MG tablet Take 1 tablet (25 mg total) by mouth daily. 90 tablet 3  . lisinopril (PRINIVIL,ZESTRIL) 10 MG tablet Take 1 tablet (10 mg total) by mouth daily. 30 tablet 1  . Multiple Vitamin (MULTIVITAMIN) tablet Take 1 tablet by mouth daily.      . Omega-3  Fatty Acids (FISH OIL PO) Take by mouth daily.    . potassium chloride (K-DUR) 10 MEQ tablet Take 1 tablet (10 mEq total) by mouth daily. 90 tablet 3   No current facility-administered medications on file prior to visit.    Allergies  Allergen Reactions  . Crestor [Rosuvastatin Calcium]     LEG PAIN  . Lipitor [Atorvastatin Calcium] Other (See Comments)    Joint pain, r/ great toe    Past Medical History  Diagnosis Date  . Left ventricular outflow tract obstruction   . Hyperlipidemia   . Hypertension     No past surgical history on file.  History  Smoking status  . Former Smoker  . Quit date: 08/25/2000  Smokeless tobacco  . Not on file    History  Alcohol Use No    Family History  Problem Relation Age of Onset  . Stroke Mother   . Transient ischemic attack Father     Reviw of Systems:  Reviewed in the HPI.  All other systems are negative.  Physical Exam: Blood pressure 122/58, pulse 54, height 5\' 10"  (1.778 m), weight 193 lb (  87.544 kg), SpO2 98 %. General: Well developed, well nourished, in no acute distress.  Head: Normocephalic, atraumatic, sclera non-icteric, mucus membranes are moist,   Neck: Supple. Carotids are 2 + without bruits. No JVD  Lungs: Clear bilaterally to auscultation.  Heart: regular rate.  normal  S1 S2. No murmurs, gallops or rubs.  Abdomen: Soft, non-tender, non-distended with normal bowel sounds. No hepatomegaly. No rebound/guarding. No masses.  Msk:  Strength and tone are normal  Extremities: No clubbing or cyanosis. No edema.  Distal pedal pulses are 2+ and equal bilaterally.  Neuro: Alert and oriented X 3. Moves all extremities spontaneously.  Psych:  Responds to questions appropriately with a normal affect.  ECG: Dec. 8, 2015:  Sinus brady at 53.  Voltage for LVH.    Assessment / Plan:

## 2014-05-16 ENCOUNTER — Other Ambulatory Visit: Payer: Self-pay | Admitting: *Deleted

## 2014-05-16 MED ORDER — LISINOPRIL 10 MG PO TABS: 10.0000 mg | ORAL_TABLET | Freq: Every day | ORAL | Status: DC

## 2014-07-03 ENCOUNTER — Other Ambulatory Visit: Payer: Self-pay

## 2014-07-03 MED ORDER — POTASSIUM CHLORIDE ER 10 MEQ PO TBCR: 10.0000 meq | EXTENDED_RELEASE_TABLET | Freq: Every day | ORAL | Status: DC

## 2014-07-03 MED ORDER — HYDROCHLOROTHIAZIDE 25 MG PO TABS: 25.0000 mg | ORAL_TABLET | Freq: Every day | ORAL | Status: DC

## 2014-10-02 ENCOUNTER — Other Ambulatory Visit: Payer: Self-pay | Admitting: *Deleted

## 2014-10-02 MED ORDER — HYDROCHLOROTHIAZIDE 25 MG PO TABS: 25.0000 mg | ORAL_TABLET | Freq: Every day | ORAL | Status: DC

## 2014-10-02 MED ORDER — POTASSIUM CHLORIDE ER 10 MEQ PO TBCR: 10.0000 meq | EXTENDED_RELEASE_TABLET | Freq: Every day | ORAL | Status: DC

## 2014-10-30 NOTE — Progress Notes (Signed)
Jeffrey Hicks Date of Birth  06/18/1945       [facility]    [place] [facility] [address]  [address] [place], [place]  [address]   [place], [place]  [address] [phone]     [phone]   Fax  [phone]    Fax [phone]  Problem List: 1. Hyperlipidemia 2. Hypertension   History of Present Illness: Jeffrey Hicks  is doing well. No chest pain or dyspnea. Did not tolerate the Lipator- caused foot pain. He stopped the lipator and the foot pain has mostly improved. He still has some discomfort.  [place] has [place]   He's been doing lots of yard work. He has not had episodes of chest pain or shortness of breath.  His BP is up a bit today.  His blood pressure readings at home have been normal.  November 03, 2012:  Jeffrey Hicks has been doing well.  He has spent lots of time in [place] .    He has been taking his BP regularly and his readings are in the 105 / 70 range.    His readings are always elevated here in the office.   Jan. 20, 2015  Jeffrey Hicks is doing well.   He records his BP regularly at home and his readings are always normal.  He is exercising - walks 2-3 miles a day.   [place].    His lipids were drawn by his medical doctor. His total cholesterol is 222. His LDL is 139. The triglyceride level is  Ow.  Dec. 8, 2015:  Jeffrey Hicks is doing well.  No CP or dyspnea. [place]  [place]  October 31, 2014:  Jeffrey Hicks  Is doing well.  No CP or dyspnea.  We changed his Bystolic to carvedilol because of cost and the fact that the insurance company would not give him the proper dose of Bystolic.   We initially tried carvedilol 25 mg twice day but he became weak and dizzy. He cut his dose to 12.5 g twice a day which she tolerates much better. His blood pressure and heart rate have been normal. He brought his blood pressure machine and his  recordings are all fairly normal.  Remains very busy working [place]   Current Outpatient Prescriptions on File Prior to Visit  Medication Sig Dispense Refill  . aspirin 81 MG tablet Take 81 mg by mouth daily. 2 Tablets    . hydrochlorothiazide (HYDRODIURIL) 25 MG tablet Take 1 tablet (25 mg total) by mouth daily. 90 tablet 2  . lisinopril (PRINIVIL,ZESTRIL) 10 MG tablet Take 1 tablet (10 mg total) by mouth daily. 30 tablet 6  . Multiple Vitamin (MULTIVITAMIN) tablet Take 1 tablet by mouth daily.      . Omega-3 Fatty Acids (FISH OIL PO) Take by mouth daily.    . potassium chloride (K-DUR) 10 MEQ tablet Take 1 tablet (10 mEq total) by mouth daily. 90 tablet 2   No current facility-administered medications on file prior to visit.    Allergies  Allergen Reactions  . Crestor  [Rosuvastatin Calcium] Other (See Comments)    Gout attack  . Lipitor  [Atorvastatin] Other (See Comments)    Gout Attack  . Crestor [Rosuvastatin Calcium]     LEG PAIN  .  Lipitor [Atorvastatin Calcium] Other (See Comments)    Joint pain, r/ great toe    Past Medical History  Diagnosis Date  . Left ventricular outflow tract obstruction   . Hyperlipidemia   . Hypertension     Past Surgical History  Procedure Laterality Date  . Other surgical history      no surgical HX    History  Smoking status  . Former Smoker  . Quit date: 08/25/2000  Smokeless tobacco  . Not on file    History  Alcohol Use No    Family History  Problem Relation Age of Onset  . Stroke Mother   . Transient ischemic attack Father   . Diabetes Brother     Reviw of Systems:  Reviewed in the HPI.  All other systems are negative.  Physical Exam: Blood pressure 130/68, pulse 74, height  (1.778 m), weight 90.719 kg (200 lb). General: Well developed, well nourished, in no acute distress.  Head: Normocephalic, atraumatic, sclera non-icteric, mucus membranes are moist,   Neck: Supple.  Carotids are 2 + without bruits. No JVD  Lungs: Clear bilaterally to auscultation.  Heart: Irreg. Irreg   normal  S1 S2. No murmurs, gallops or rubs.  Abdomen: Soft, non-tender, non-distended with normal bowel sounds. No hepatomegaly. No rebound/guarding. No masses.  Msk:  Strength and tone are normal  Extremities: No clubbing or cyanosis. No edema.  Distal pedal pulses are 2+ and equal bilaterally.  Neuro: Alert and oriented X 3. Moves all extremities spontaneously.  Psych:  Responds to questions appropriately with a normal affect.  ECG: October 31, 2014:  Atrial fib at 41 .  NS ST abn.   Assessment / Plan:   1. Atrial fibrillation:  Jeffrey Marshall  has been found to have new onset atrial fibrillation. His heart rate is well-controlled.  He's completely asymptomatic. In fact he did not even know that he was in atrial fibrillation.   We will go ahead and start him on Eliquis 5 mg twice a day. We will get an echo card gram.  We'll check a basic medical profile in several days. His last creatinine that we have was 1.39.   I'm not sure that he necessarily needs to be cardioverted. He is completely asymptomatic and his rate is well-controlled.    Will decide about cardioversion at a later time .    2. Hypertension - blood pressures well-controlled.  Continue current meds   3. Hyperlipidemia    [name], [name], MD  10/31/2014 5:11 PM    [facility] [address] [place],  [address] [place], [place]  [address] Pager [phone] Phone: [phone]; Fax: [phone]   [facility]  [address] [place], [place]  [address] [phone]    Fax [phone]

## 2014-10-31 ENCOUNTER — Encounter: Payer: Self-pay | Admitting: Cardiovascular Disease

## 2014-10-31 ENCOUNTER — Ambulatory Visit (INDEPENDENT_AMBULATORY_CARE_PROVIDER_SITE_OTHER): Payer: Medicare Other | Admitting: Cardiovascular Disease

## 2014-10-31 VITALS — BP 130/68 | HR 74 | Ht 70.0 in | Wt 200.0 lb

## 2014-10-31 DIAGNOSIS — I481 Persistent atrial fibrillation: Secondary | ICD-10-CM | POA: Diagnosis not present

## 2014-10-31 DIAGNOSIS — E785 Hyperlipidemia, unspecified: Secondary | ICD-10-CM | POA: Diagnosis not present

## 2014-10-31 DIAGNOSIS — I4819 Other persistent atrial fibrillation: Secondary | ICD-10-CM

## 2014-10-31 DIAGNOSIS — I10 Essential (primary) hypertension: Secondary | ICD-10-CM

## 2014-10-31 MED ORDER — APIXABAN 5 MG PO TABS: 5.0000 mg | ORAL_TABLET | Freq: Two times a day (BID) | ORAL | Status: DC

## 2014-10-31 NOTE — Patient Instructions (Addendum)
Check the price of the following anticoagulants:  Pradaxa 150 mg twice a day Xarelto 20 mg a day Eliquis 5 mg twice a day Savaysa 60 mg a day.  Medication Instructions:  STOP Aspirin START Eliquis 5 mg twice daily - take 12 hours apart  Labwork: Your physician recommends that you return for lab work TOMORROW between 7:30 am and 4:30 pm for CBC, BMET   Testing/Procedures: Your physician has requested that you have an echocardiogram. Echocardiography is a painless test that uses sound waves to create images of your heart. It provides your doctor with information about the size and shape of your heart and how well your heart's chambers and valves are working. This procedure takes approximately one hour. There are no restrictions for this procedure.   Follow-Up: Your physician recommends that you schedule a follow-up appointment in: 1 MONTH with [facility]    Your physician wants you to follow-up in: 6 months with Dr. Elease HashimotoNahser.  You will receive a reminder letter in the mail two months in advance. If you don't receive a letter, please call our office to schedule the follow-up appointment.

## 2014-11-01 ENCOUNTER — Other Ambulatory Visit (INDEPENDENT_AMBULATORY_CARE_PROVIDER_SITE_OTHER): Payer: Medicare Other | Admitting: *Deleted

## 2014-11-01 DIAGNOSIS — I481 Persistent atrial fibrillation: Secondary | ICD-10-CM | POA: Diagnosis not present

## 2014-11-01 DIAGNOSIS — I4819 Other persistent atrial fibrillation: Secondary | ICD-10-CM

## 2014-11-01 DIAGNOSIS — E785 Hyperlipidemia, unspecified: Secondary | ICD-10-CM

## 2014-11-01 DIAGNOSIS — I10 Essential (primary) hypertension: Secondary | ICD-10-CM

## 2014-11-01 LAB — BASIC METABOLIC PANEL
BUN: 17 mg/dL (ref 6–23)
CHLORIDE: 103 meq/L (ref 96–112)
CO2: 29 mEq/L (ref 19–32)
CREATININE: 1.26 mg/dL (ref 0.40–1.50)
Calcium: 9.4 mg/dL (ref 8.4–10.5)
GFR: 60.18 mL/min (ref >60.00)
Glucose, Bld: 116 mg/dL — ABNORMAL HIGH (ref 70–99)
Potassium: 4.3 mEq/L (ref 3.5–5.1)
Sodium: 138 mEq/L (ref 135–145)

## 2014-11-01 LAB — CBC WITH DIFFERENTIAL/PLATELET
BASOS ABS: 0.1 10*3/uL (ref 0.0–0.1)
BASOS PCT: 0.9 % (ref 0.0–3.0)
EOS ABS: 0.2 10*3/uL (ref 0.0–0.7)
EOS PCT: 3 % (ref 0.0–5.0)
HEMATOCRIT: 40.8 % (ref 39.0–52.0)
HEMOGLOBIN: 13.6 g/dL (ref 13.0–17.0)
Lymphocytes Relative: 27.3 % (ref 12.0–46.0)
Lymphs Abs: 1.5 10*3/uL (ref 0.7–4.0)
MCHC: 33.3 g/dL (ref 30.0–36.0)
MCV: 96.8 fl (ref 78.0–100.0)
MONO ABS: 0.6 10*3/uL (ref 0.1–1.0)
Monocytes Relative: 10.3 % (ref 3.0–12.0)
Neutro Abs: 3.2 10*3/uL (ref 1.4–7.7)
Neutrophils Relative %: 58.5 % (ref 43.0–77.0)
Platelets: 183 10*3/uL (ref 150.0–400.0)
RBC: 4.22 Mil/uL (ref 4.22–5.81)
RDW: 13.6 % (ref 11.5–15.5)
WBC: 5.5 10*3/uL (ref 4.0–10.5)

## 2014-11-01 NOTE — Addendum Note (Signed)
Addended by: Tonita PhoenixBOWDEN, Zaheer Wageman K on: 11/01/2014 07:34 AM   Modules accepted: Orders

## 2014-11-02 ENCOUNTER — Telehealth: Payer: Self-pay

## 2014-11-02 NOTE — Telephone Encounter (Signed)
Prior auth for Eliquis  sent to [facility] via [employer] Meds.

## 2014-11-02 NOTE — Telephone Encounter (Signed)
Prior auth for Eliquis 5mg sent to [facility] via Cover My Meds. 

## 2014-11-05 ENCOUNTER — Ambulatory Visit (HOSPITAL_COMMUNITY): Payer: Medicare Other | Attending: Cardiology

## 2014-11-05 ENCOUNTER — Other Ambulatory Visit: Payer: Self-pay

## 2014-11-05 DIAGNOSIS — I10 Essential (primary) hypertension: Secondary | ICD-10-CM

## 2014-11-05 DIAGNOSIS — I481 Persistent atrial fibrillation: Secondary | ICD-10-CM | POA: Diagnosis present

## 2014-11-05 DIAGNOSIS — I071 Rheumatic tricuspid insufficiency: Secondary | ICD-10-CM | POA: Insufficient documentation

## 2014-11-05 DIAGNOSIS — I340 Nonrheumatic mitral (valve) insufficiency: Secondary | ICD-10-CM | POA: Insufficient documentation

## 2014-11-05 DIAGNOSIS — I4891 Unspecified atrial fibrillation: Secondary | ICD-10-CM | POA: Insufficient documentation

## 2014-11-05 DIAGNOSIS — I4819 Other persistent atrial fibrillation: Secondary | ICD-10-CM

## 2014-12-03 ENCOUNTER — Ambulatory Visit (INDEPENDENT_AMBULATORY_CARE_PROVIDER_SITE_OTHER): Payer: Medicare Other | Admitting: Pharmacist

## 2014-12-03 VITALS — Wt 201.0 lb

## 2014-12-03 DIAGNOSIS — I480 Paroxysmal atrial fibrillation: Secondary | ICD-10-CM | POA: Diagnosis not present

## 2014-12-03 LAB — CBC
HCT: 41 % (ref 39.0–52.0)
Hemoglobin: 13.7 g/dL (ref 13.0–17.0)
MCHC: 33.4 g/dL (ref 30.0–36.0)
MCV: 96.4 fl (ref 78.0–100.0)
Platelets: 190 10*3/uL (ref 150.0–400.0)
RBC: 4.25 Mil/uL (ref 4.22–5.81)
RDW: 13.3 % (ref 11.5–15.5)
WBC: 5.1 10*3/uL (ref 4.0–10.5)

## 2014-12-03 LAB — BASIC METABOLIC PANEL
BUN: 22 mg/dL (ref 6–23)
CO2: 30 mEq/L (ref 19–32)
Calcium: 9.4 mg/dL (ref 8.4–10.5)
Chloride: 106 mEq/L (ref 96–112)
Creatinine, Ser: 1.29 mg/dL (ref 0.40–1.50)
GFR: 58.55 mL/min — ABNORMAL LOW (ref >60.00)
Glucose, Bld: 125 mg/dL — ABNORMAL HIGH (ref 70–99)
Potassium: 5.1 mEq/L (ref 3.5–5.1)
Sodium: 140 mEq/L (ref 135–145)

## 2014-12-03 NOTE — Progress Notes (Signed)
Patient was started on Eliquis for PAF on 11/01/14.  Reviewed patients medication list.  Pt is not currently on any combined P-gp and strong CYP3A4 inhibitors/inducers (ketoconazole, traconazole, ritonavir, carbamazepine, phenytoin, rifampin, St. John's wort).  Reviewed labs.  SCr 1.29 and stable, Weight 201 lbs, CrCl 70.  Dose appropriate based on CrCl.   Hgb and HCT stable at 13.7 and 41.   A full discussion of the nature of anticoagulants has been carried out.  A benefit/risk analysis has been presented to the patient, so that they understand the justification for choosing anticoagulation with Eliquis at this time.  The need for compliance is stressed.  Pt is aware to take the medication twice daily.  Side effects of potential bleeding are discussed, including unusual colored urine or stools, coughing up blood or coffee ground emesis, nose bleeds or serious fall or head trauma.  Discussed signs and symptoms of stroke. The patient should avoid any OTC items containing aspirin or ibuprofen.  Avoid alcohol consumption.   Call if any signs of abnormal bleeding.  Discussed financial obligations and resolved any difficulty in obtaining medication.  Next lab test test in 6 months.

## 2014-12-03 NOTE — Patient Instructions (Signed)
It was nice to see you in clinic today. Continue taking your Eliquis twice a day. We will call you with the results of your blood work. Next Eliquis follow-up appointment is Monday, January 16 at 9:15am

## 2015-01-15 ENCOUNTER — Other Ambulatory Visit: Payer: Self-pay

## 2015-01-15 MED ORDER — LISINOPRIL 10 MG PO TABS: 10.0000 mg | ORAL_TABLET | Freq: Every day | ORAL | Status: DC

## 2015-01-15 NOTE — Telephone Encounter (Signed)
Jeffrey Mixer, MD at 10/30/2014 6:12 PM  lisinopril (PRINIVIL,ZESTRIL) 10 MG tabletTake 1 tablet (10 mg total) by mouth daily Hypertension - blood pressures well-controlled. Continue current meds

## 2015-05-06 ENCOUNTER — Ambulatory Visit (INDEPENDENT_AMBULATORY_CARE_PROVIDER_SITE_OTHER): Payer: Medicare Other | Admitting: Pharmacist

## 2015-05-06 ENCOUNTER — Encounter: Payer: Self-pay | Admitting: Pharmacist

## 2015-05-06 VITALS — Wt 207.5 lb

## 2015-05-06 DIAGNOSIS — I4891 Unspecified atrial fibrillation: Secondary | ICD-10-CM | POA: Diagnosis not present

## 2015-05-06 LAB — BASIC METABOLIC PANEL
BUN: 17 mg/dL (ref 7–25)
CALCIUM: 9.4 mg/dL (ref 8.6–10.3)
CO2: 30 mmol/L (ref 20–31)
CREATININE: 1.4 mg/dL — AB (ref 0.70–1.18)
Chloride: 100 mmol/L (ref 98–110)
Glucose, Bld: 110 mg/dL — ABNORMAL HIGH (ref 65–99)
Potassium: 4.7 mmol/L (ref 3.5–5.3)
Sodium: 138 mmol/L (ref 135–146)

## 2015-05-06 LAB — CBC
HCT: 40.9 % (ref 39.0–52.0)
Hemoglobin: 13.8 g/dL (ref 13.0–17.0)
MCH: 32.8 pg (ref 26.0–34.0)
MCHC: 33.7 g/dL (ref 30.0–36.0)
MCV: 97.1 fL (ref 78.0–100.0)
MPV: 10.4 fL (ref 8.6–12.4)
PLATELETS: 184 10*3/uL (ref 150–400)
RBC: 4.21 MIL/uL — AB (ref 4.22–5.81)
RDW: 13.4 % (ref 11.5–15.5)
WBC: 6.4 10*3/uL (ref 4.0–10.5)

## 2015-05-06 NOTE — Progress Notes (Signed)
Pt was started on Eliquis 5mg  BID for atrial fibrillation on 10/31/14 by Dr. Elease HashimotoNahser.  Reviewed patients medication list.  Pt is not currently on any combined P-gp and strong CYP3A4 inhibitors/inducers (ketoconazole, traconazole, ritonavir, carbamazepine, phenytoin, rifampin, St. John's wort).  Reviewed labs.  SCr 1.4, Weight 94.1kg, age 70.  Dose appropriate based on weight, age, and SCr.   Hgb and HCT wnl at 13.8 and 40.9.  A full discussion of the nature of anticoagulants has been carried out.  A benefit/risk analysis has been presented to the patient, so that they understand the justification for choosing anticoagulation with Eliquis at this time.  The need for compliance is stressed.  Pt is aware to take the medication twice daily.  Side effects of potential bleeding are discussed, including unusual colored urine or stools, coughing up blood or coffee ground emesis, nose bleeds or serious fall or head trauma.  Discussed signs and symptoms of stroke. The patient should avoid any OTC items containing aspirin or ibuprofen.  Avoid alcohol consumption.   Call if any signs of abnormal bleeding.  Discussed financial obligations and resolved any difficulty in obtaining medication.

## 2015-05-10 ENCOUNTER — Other Ambulatory Visit: Payer: Self-pay

## 2015-05-10 MED ORDER — CARVEDILOL 25 MG PO TABS: 12.5000 mg | ORAL_TABLET | Freq: Two times a day (BID) | ORAL | Status: DC

## 2015-05-13 ENCOUNTER — Encounter: Payer: Self-pay | Admitting: Cardiovascular Disease

## 2015-05-13 ENCOUNTER — Ambulatory Visit (INDEPENDENT_AMBULATORY_CARE_PROVIDER_SITE_OTHER): Payer: Medicare Other | Admitting: Cardiovascular Disease

## 2015-05-13 VITALS — BP 140/84 | HR 88 | Ht 70.0 in | Wt 207.1 lb

## 2015-05-13 DIAGNOSIS — I482 Chronic atrial fibrillation, unspecified: Secondary | ICD-10-CM

## 2015-05-13 DIAGNOSIS — I480 Paroxysmal atrial fibrillation: Secondary | ICD-10-CM | POA: Insufficient documentation

## 2015-05-13 DIAGNOSIS — Z79899 Other long term (current) drug therapy: Secondary | ICD-10-CM

## 2015-05-13 DIAGNOSIS — I10 Essential (primary) hypertension: Secondary | ICD-10-CM

## 2015-05-13 NOTE — Progress Notes (Signed)
Jeffrey Hicks Date of Birth  [birth year]       [facility] Office [address], [address]  [address], [address] [place], [place]  [address]   [place], [place]  [address] [phone]     [phone]   Fax  [phone]    Fax [phone]  Problem List: 1. Hyperlipidemia 2. Hypertension   History of Present Illness: Jeffrey Hicks  is doing well. No chest pain or dyspnea. Did not tolerate the Lipator- caused foot pain. He stopped the lipator and the foot pain has mostly improved. He still has some discomfort.  He has been up to his [place] house in [place], [place].   He's been doing lots of yard work. He has not had episodes of chest pain or shortness of breath.  His BP is up a bit today.  His blood pressure readings at home have been normal.  November 03, 2012:  Jeffrey Hicks has been doing well.  He has spent lots of time in [place] .    He has been taking his BP regularly and his readings are in the 105 / 70 range.    His readings are always elevated here in the office.   Jan. 20, 2015  Jeffrey Hicks is doing well.   He records his BP regularly at home and his readings are always normal.  He is exercising - walks 2-3 miles a day.   [place]    His lipids were drawn by his medical doctor. His total cholesterol is 222. His LDL is 139. The triglyceride level is  Ow.  Dec. 8, 2015:  Jeffrey Hicks is doing well.  No CP or dyspnea. Spent more time up in the [place] - was able to fish more.  He is on the [employer].  October 31, 2014:  Jeffrey Hicks  Is doing well.  No CP or dyspnea.  We changed his Bystolic to carvedilol because of cost and the fact that the insurance company would not give him the proper dose of Bystolic.   We initially tried carvedilol 25 mg twice day but he became weak and dizzy. He cut his dose to 12.5 g twice a day which she tolerates much better. His blood pressure and heart rate have been normal. He brought his blood pressure machine and his  recordings are all fairly normal.  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 23, 2017  Jeffrey Hicks is seen back today for his persistent atrial fibrillation, hypertension, and hyperlipidemia. Remains on Eliquis . He's doing very well. He's not having any episodes of chest pain or shortness breath. Able to do all that he wants to , Has some gout pains.     Current Outpatient Prescriptions on File Prior to Visit  Medication Sig Dispense Refill  . apixaban (ELIQUIS) 5 MG TABS tablet Take 1 tablet (5 mg total) by mouth 2 (two) times daily. 60 tablet 11  . carvedilol (COREG) 25 MG tablet Take 0.5 tablets (12.5 mg total) by mouth 2 (two) times daily with a meal. 180 tablet 0  . hydrochlorothiazide (HYDRODIURIL) 25 MG tablet Take 1 tablet (25 mg total) by mouth daily. 90 tablet 2  . lisinopril (PRINIVIL,ZESTRIL) 10 MG tablet Take 1 tablet (10 mg total) by mouth daily. 90 tablet 1  . Multiple Vitamin (MULTIVITAMIN) tablet Take 1 tablet by mouth daily.      . Omega-3 Fatty Acids (FISH  OIL PO) Take 1,000 mg by mouth daily.     . potassium chloride (K-DUR) 10 MEQ tablet Take 1 tablet (10 mEq total) by mouth daily. 90 tablet 2   No current facility-administered medications on file prior to visit.    Allergies  Allergen Reactions  . Crestor  [Rosuvastatin Calcium] Other (See Comments)    Gout attack  . Lipitor  [Atorvastatin] Other (See Comments)    Gout Attack  . Crestor [Rosuvastatin Calcium]     LEG PAIN  . Lipitor [Atorvastatin Calcium] Other (See Comments)    Joint pain, r/ great toe    Past Medical History  Diagnosis Date  . Left ventricular outflow tract obstruction   . Hyperlipidemia   . Hypertension     Past Surgical History  Procedure Laterality Date  . Other surgical history      no surgical HX    History  Smoking status  . Former Smoker  . Quit date: 08/25/2000  Smokeless tobacco  . Not on file    History  Alcohol Use No    Family  History  Problem Relation Age of Onset  . Stroke Mother   . Transient ischemic attack Father   . Diabetes Brother     Reviw of Systems:  Reviewed in the HPI.  All other systems are negative.  Physical Exam: Blood pressure 140/84, pulse 88, height  (1.778 m), weight 207 lb 1.9 oz (93.949 kg). General: Well developed, well nourished, in no acute distress. Head: Normocephalic, atraumatic, sclera non-icteric, mucus membranes are moist,  Neck: Supple. Carotids are 2 + without bruits. No JVD Lungs: Clear bilaterally to auscultation. Heart: Irreg. Irreg   normal  S1 S2. No murmurs, gallops or rubs. Abdomen: Soft, non-tender, non-distended with normal bowel sounds. No hepatomegaly. No rebound/guarding. No masses. Msk:  Strength and tone are normal Extremities: No clubbing or cyanosis. No edema.  Distal pedal pulses are 2+ and equal bilaterally. Neuro: Alert and oriented X 3. Moves all extremities spontaneously.  Psych:  Responds to questions appropriately with a normal affect.  ECG: October 31, 2014:  Atrial fib at 34 .  NS ST abn.   Assessment / Plan:   1. Atrial fibrillation:  Jeffrey Hicks  Has  atrial fibrillation. His heart rate is well-controlled.  He's completely asymptomatic. In fact he did not even know that he was in atrial fibrillation.   He is  on Eliquis 5 mg twice a day.  Echo reveals normal LV function .  Mild MR.    Moderate TR  His left atrium is severely dilated.   I'm not sure that  A cardioversion would hold.   Will continue with a rate control and anticoagulation strategy .  2. Hypertension - blood pressures well-controlled.  Continue current meds   3. Hyperlipidemia    Dayonna Selbe, [name], MD  05/13/2015 8:57 AM    [facility] [address] [place],  [address] [place], [place]  [address] Pager [phone] Phone: [phone]; Fax: [phone]

## 2015-05-13 NOTE — Patient Instructions (Signed)
Medication Instructions:  The current medical regimen is effective;  continue present plan and medications.  Labwork: Please have blood work in 1 yr when you see Dr Elease Hashimoto [facility])  Follow-Up: Follow up in 1 year with Dr. Elease Hashimoto.  You will receive a letter in the mail 2 months before you are due.  Please call us when you receive this letter to schedule your follow up appointment.   If you need a refill on your cardiac medications before your next appointment, please call your pharmacy.  Thank you for choosing [facility]!!

## 2015-07-24 ENCOUNTER — Other Ambulatory Visit: Payer: Self-pay | Admitting: *Deleted

## 2015-07-24 MED ORDER — LISINOPRIL 10 MG PO TABS: 10.0000 mg | ORAL_TABLET | Freq: Every day | ORAL | Status: DC

## 2015-07-24 MED ORDER — POTASSIUM CHLORIDE ER 10 MEQ PO TBCR: 10.0000 meq | EXTENDED_RELEASE_TABLET | Freq: Every day | ORAL | Status: DC

## 2015-07-24 MED ORDER — HYDROCHLOROTHIAZIDE 25 MG PO TABS: 25.0000 mg | ORAL_TABLET | Freq: Every day | ORAL | Status: DC

## 2015-12-02 ENCOUNTER — Other Ambulatory Visit: Payer: Self-pay | Admitting: *Deleted

## 2015-12-02 MED ORDER — CARVEDILOL 25 MG PO TABS: 12.5000 mg | ORAL_TABLET | Freq: Two times a day (BID) | ORAL | 1 refills | Status: DC

## 2015-12-04 ENCOUNTER — Other Ambulatory Visit: Payer: Self-pay | Admitting: *Deleted

## 2015-12-04 MED ORDER — APIXABAN 5 MG PO TABS: 5.0000 mg | ORAL_TABLET | Freq: Two times a day (BID) | ORAL | 5 refills | Status: DC

## 2016-06-15 ENCOUNTER — Other Ambulatory Visit: Payer: Self-pay | Admitting: Cardiovascular Disease

## 2016-06-19 ENCOUNTER — Other Ambulatory Visit: Payer: Self-pay | Admitting: Cardiovascular Disease

## 2016-06-19 NOTE — Telephone Encounter (Signed)
Pt last saw Dr Elease HashimotoNahser on 05/13/15, last BMP 05/13/15 Creat 1.4, pt's age is [age], weight 93.9kg.  Pt is on appropriate dosage of Eliquis 5mg  BID.  Pt is overdue for follow-up and blood work, but has appt scheduled for follow-up with Dr Elease HashimotoNahser on 08/13/16.  Will refill Eliquis x 2 months and await recheck and repeat lab work at that time.

## 2016-07-27 ENCOUNTER — Encounter: Payer: Self-pay | Admitting: Cardiovascular Disease

## 2016-08-13 ENCOUNTER — Ambulatory Visit (INDEPENDENT_AMBULATORY_CARE_PROVIDER_SITE_OTHER): Payer: Medicare Other | Admitting: Cardiovascular Disease

## 2016-08-13 ENCOUNTER — Encounter: Payer: Self-pay | Admitting: Cardiovascular Disease

## 2016-08-13 VITALS — BP 150/94 | HR 91 | Ht 70.0 in | Wt 211.6 lb

## 2016-08-13 DIAGNOSIS — I10 Essential (primary) hypertension: Secondary | ICD-10-CM

## 2016-08-13 DIAGNOSIS — I480 Paroxysmal atrial fibrillation: Secondary | ICD-10-CM

## 2016-08-13 DIAGNOSIS — E875 Hyperkalemia: Secondary | ICD-10-CM | POA: Diagnosis not present

## 2016-08-13 MED ORDER — APIXABAN 5 MG PO TABS: 5.0000 mg | ORAL_TABLET | Freq: Two times a day (BID) | ORAL | 3 refills | Status: DC

## 2016-08-13 MED ORDER — CARVEDILOL 25 MG PO TABS: ORAL_TABLET | ORAL | 3 refills | Status: DC

## 2016-08-13 MED ORDER — LISINOPRIL 10 MG PO TABS: 10.0000 mg | ORAL_TABLET | Freq: Every day | ORAL | 3 refills | Status: DC

## 2016-08-13 MED ORDER — HYDROCHLOROTHIAZIDE 25 MG PO TABS: 12.5000 mg | ORAL_TABLET | Freq: Every day | ORAL | 3 refills | Status: DC

## 2016-08-13 NOTE — Progress Notes (Signed)
Jeffrey Hicks Date of Birth  [birth year]       [facility] Office [address]  [address] [place], [place]  [address]   [place], [place]  [address] [phone]     [phone]   Fax  [phone]    Fax [phone]  Problem List: 1. Hyperlipidemia 2. Hypertension   History of Present Illness: Chanetta Marshall  is doing well. No chest pain or dyspnea. Did not tolerate the Lipator- caused foot pain. He stopped the lipator and the foot pain has mostly improved. He still has some discomfort.  He has been up to his mountain house in [place], [place].   He's been doing lots of yard work. He has not had episodes of chest pain or shortness of breath.  His BP is up a bit today.  His blood pressure readings at home have been normal.  November 03, 2012:  Chanetta Marshall has been doing well.  He has spent lots of time in [place] .    He has been taking his BP regularly and his readings are in the 105 / 70 range.    His readings are always elevated here in the office.   Jan. 20, 2015  Chanetta Marshall is doing well.   He records his BP regularly at home and his readings are always normal.  He is exercising - walks 2-3 miles a day.   [place] farm.    His lipids were drawn by his medical doctor. His total cholesterol is 222. His LDL is 139. The triglyceride level is  Ow.  Dec. 8, 2015:  Chanetta Marshall is doing well.  No CP or dyspnea. Spent more time up in the [place] - was able to fish more.  He is on the [employer].  October 31, 2014:  [name]  Is doing well.  No CP or dyspnea.  We changed his Bystolic to carvedilol because of cost and the fact that the insurance company would not give him the proper dose of Bystolic.   We initially tried carvedilol 25 mg twice day but he became weak and dizzy. He cut his dose to 12.5 g twice a day which she tolerates much better. His blood pressure and heart rate have been normal. He brought his blood pressure machine and his  recordings are all fairly normal.  Remains very busy working on his mountain home up in [place] , [place]   Jan. 23, 2017  Chanetta Marshall is seen back today for his persistent atrial fibrillation, hypertension, and hyperlipidemia. Remains on Eliquis . He's doing very well. He's not having any episodes of chest pain or shortness breath. Able to do all that he wants to , Has some gout pains.    August 13, 2016:  Chanetta Marshall is seen today for follow-up of his hypertension and atrial fibrillation. BP is a little elevated today here in the office.   BP has been ok at home .  Has chronic AF.  Is on Eliquis     Current Outpatient Prescriptions on File Prior to Visit  Medication Sig Dispense Refill  . carvedilol (COREG) 25 MG tablet TAKE 1/2 TABLET BY MOUTH TWICE DAILY WITH A MEAL 90 tablet 0  . ELIQUIS 5 MG TABS tablet TAKE ONE TABLET BY MOUTH TWICE DAILY 60 tablet 2  . hydrochlorothiazide (HYDRODIURIL) 25 MG tablet Take 1 tablet (25 mg total) by mouth daily. 90 tablet 3  . lisinopril (PRINIVIL,ZESTRIL) 10 MG  tablet Take 1 tablet (10 mg total) by mouth daily. 90 tablet 3  . Multiple Vitamin (MULTIVITAMIN) tablet Take 1 tablet by mouth daily.      . Omega-3 Fatty Acids (FISH OIL PO) Take 1,000 mg by mouth daily.     . potassium chloride (K-DUR) 10 MEQ tablet Take 1 tablet (10 mEq total) by mouth daily. 90 tablet 3   No current facility-administered medications on file prior to visit.     Allergies  Allergen Reactions  . Crestor  [Rosuvastatin Calcium] Other (See Comments)    Gout attack  . Lipitor  [Atorvastatin] Other (See Comments)    Gout Attack  . Crestor [Rosuvastatin Calcium]     LEG PAIN  . Lipitor [Atorvastatin Calcium] Other (See Comments)    Joint pain, r/ great toe    Past Medical History:  Diagnosis Date  . Hyperlipidemia   . Hypertension   . Left ventricular outflow tract obstruction     Past Surgical History:  Procedure Laterality Date  . OTHER SURGICAL HISTORY     no surgical  HX    History  Smoking Status  . Former Smoker  . Quit date: 08/25/2000  Smokeless Tobacco  . Never Used    History  Alcohol Use No    Family History  Problem Relation Age of Onset  . Stroke Mother   . Transient ischemic attack Father   . Diabetes Brother     Reviw of Systems:  Reviewed in the HPI.  All other systems are negative.  Physical Exam: Blood pressure (!) 150/94, pulse 91, height  (1.778 m), weight 211 lb 9.6 oz (96 kg), SpO2 96 %. General: Well developed, well nourished, in no acute distress. Head: Normocephalic, atraumatic, sclera non-icteric, mucus membranes are moist,  Neck: Supple. Carotids are 2 + without bruits. No JVD Lungs: Clear bilaterally to auscultation. Heart: Irreg. Irreg   normal  S1 S2. No murmurs, gallops or rubs. Abdomen: Soft, non-tender, non-distended with normal bowel sounds. No hepatomegaly. No rebound/guarding. No masses. Msk:  Strength and tone are normal Extremities: No clubbing or cyanosis. No edema.  Distal pedal pulses are 2+ and equal bilaterally. Neuro: Alert and oriented X 3. Moves all extremities spontaneously.  Psych:  Responds to questions appropriately with a normal affect.  ECG: August 13, 2016:  Atrial fib at 22.   No ST or T wave changes    Assessment / Plan:   1. Atrial fibrillation:  [name]  Has  atrial fibrillation. His heart rate is well-controlled.  He's completely asymptomatic. In fact he did not even know that he was in atrial fibrillation.   He is  on Eliquis 5 mg twice a day.  Echo reveals normal LV function .  Mild MR.    Moderate TR  His left atrium is severely dilated.   I'm not sure that  A cardioversion would hold.   Will continue with a rate control and anticoagulation strategy .  2. Hypertension - blood pressures well-controlled.  Continue current meds   3. Hyperlipidemia - managed by Dr Salvadore Farber. Will request most recent lab work    Kristeen Miss, MD  08/13/2016 12:07 PM    [facility] [address]  [address] [place], [place]  [address] Pager [phone] Phone: [phone]; Fax: [phone]

## 2016-08-13 NOTE — Patient Instructions (Signed)
Medication Instructions:  DECREASE HCTZ (Hydrochlorothiazide) to 12.5 mg once daily STOP Kdur (potassium)   Labwork: Your physician recommends that you return for lab work in: 3 weeks for uric acid level and BMET   Testing/Procedures: None Ordered   Follow-Up: Your physician wants you to follow-up in: 1 year with Dr. Elease Hashimoto.  You will receive a reminder letter in the mail two months in advance. If you don't receive a letter, please call our office to schedule the follow-up appointment.   If you need a refill on your cardiac medications before your next appointment, please call your pharmacy.   Thank you for choosing [facility]! Eligha Bridegroom, RN [phone]

## 2016-09-02 ENCOUNTER — Other Ambulatory Visit: Payer: Medicare Other

## 2016-09-15 ENCOUNTER — Other Ambulatory Visit: Payer: Medicare Other

## 2016-09-15 ENCOUNTER — Encounter (INDEPENDENT_AMBULATORY_CARE_PROVIDER_SITE_OTHER): Payer: Self-pay

## 2016-09-15 DIAGNOSIS — E875 Hyperkalemia: Secondary | ICD-10-CM

## 2016-09-15 DIAGNOSIS — I480 Paroxysmal atrial fibrillation: Secondary | ICD-10-CM

## 2016-09-15 DIAGNOSIS — I10 Essential (primary) hypertension: Secondary | ICD-10-CM

## 2016-09-15 LAB — BASIC METABOLIC PANEL
BUN/Creatinine Ratio: 13 (ref 10–24)
BUN: 19 mg/dL (ref 8–27)
CHLORIDE: 100 mmol/L (ref 96–106)
CO2: 22 mmol/L (ref 18–29)
CREATININE: 1.52 mg/dL — AB (ref 0.76–1.27)
Calcium: 9.2 mg/dL (ref 8.6–10.2)
GFR calc Af Amer: 53 mL/min/{1.73_m2} — ABNORMAL LOW (ref >59)
GFR calc non Af Amer: 45 mL/min/{1.73_m2} — ABNORMAL LOW (ref >59)
GLUCOSE: 113 mg/dL — AB (ref 65–99)
POTASSIUM: 5 mmol/L (ref 3.5–5.2)
SODIUM: 136 mmol/L (ref 134–144)

## 2016-09-15 LAB — URIC ACID: URIC ACID: 7.9 mg/dL (ref 3.7–8.6)

## 2017-09-07 ENCOUNTER — Other Ambulatory Visit: Payer: Self-pay | Admitting: Cardiovascular Disease

## 2017-10-22 ENCOUNTER — Other Ambulatory Visit: Payer: Self-pay

## 2017-10-22 MED ORDER — APIXABAN 5 MG PO TABS: 5.0000 mg | ORAL_TABLET | Freq: Two times a day (BID) | ORAL | 0 refills | Status: DC

## 2017-10-22 MED ORDER — CARVEDILOL 25 MG PO TABS
ORAL_TABLET | ORAL | 0 refills | Status: DC
Start: 2017-10-22 — End: 2017-12-29

## 2017-10-22 MED ORDER — HYDROCHLOROTHIAZIDE 25 MG PO TABS: 12.5000 mg | ORAL_TABLET | Freq: Every day | ORAL | 0 refills | Status: DC

## 2017-10-22 NOTE — Telephone Encounter (Signed)
Eliquis 5mg  refill request received; pt is 72 yrs old, wt-96kg, last seen by Dr. Elease HashimotoNahser on 08/13/16 and has an appt on 01/13/17, Crea-1.52 on 09/15/16-labs over a year old so pt needs updated labs; placed a note on appt to obtain CBC & BMET at the visit. Will send in a refill for the 3 months that are requested.

## 2017-12-29 ENCOUNTER — Ambulatory Visit: Payer: Medicare Other | Admitting: Cardiovascular Disease

## 2017-12-29 ENCOUNTER — Encounter: Payer: Self-pay | Admitting: Cardiovascular Disease

## 2017-12-29 VITALS — BP 130/82 | HR 81 | Ht 70.0 in | Wt 208.1 lb

## 2017-12-29 DIAGNOSIS — I482 Chronic atrial fibrillation, unspecified: Secondary | ICD-10-CM

## 2017-12-29 MED ORDER — HYDROCHLOROTHIAZIDE 12.5 MG PO TABS: 12.5000 mg | ORAL_TABLET | Freq: Every day | ORAL | 3 refills | Status: DC

## 2017-12-29 MED ORDER — LISINOPRIL 10 MG PO TABS: 10.0000 mg | ORAL_TABLET | Freq: Every day | ORAL | 3 refills | Status: DC

## 2017-12-29 MED ORDER — CARVEDILOL 12.5 MG PO TABS: 12.5000 mg | ORAL_TABLET | Freq: Two times a day (BID) | ORAL | 3 refills | Status: DC

## 2017-12-29 NOTE — Patient Instructions (Signed)
Medication Instructions:  Your physician recommends that you continue on your current medications as directed. Please refer to the Current Medication list given to you today. **NOTE that some of your prescription bottles may look different due to giving you correct doses of your medications   Labwork: None Ordered   Testing/Procedures: None Ordered   Follow-Up: Your physician wants you to follow-up in: 1 year with Dr. Elease Hashimoto. You will receive a reminder letter in the mail two months in advance. If you don't receive a letter, please call our office to schedule the follow-up appointment.   If you need a refill on your cardiac medications before your next appointment, please call your pharmacy.   Thank you for choosing [facility]! Eligha Bridegroom, RN [phone]

## 2017-12-29 NOTE — Progress Notes (Signed)
[name] Date of Birth  05/31/1945       [facility] Office [address]  [address] [place], [place]  [address]   [place], [place]  [address] [phone]     [phone]   Fax  [phone]    Fax [phone]  Problem List: 1. Hyperlipidemia 2. Hypertension   Jeffrey Hicks  is doing well. No chest pain or dyspnea. Did not tolerate the Lipator- caused foot pain. He stopped the lipator and the foot pain has mostly improved. He still has some discomfort.  He has been up to his mountain house in [place], [place].   He's been doing lots of yard work. He has not had episodes of chest pain or shortness of breath.  His BP is up a bit today.  His blood pressure readings at home have been normal.  November 03, 2012:  Jeffrey Hicks has been doing well.  He has spent lots of time in [place] .    He has been taking his BP regularly and his readings are in the 105 / 70 range.    His readings are always elevated here in the office.   Jan. 20, 2015  Jeffrey Hicks is doing well.   He records his BP regularly at home and his readings are always normal.  He is exercising - walks 2-3 miles a day.   [place].    His lipids were drawn by his medical doctor. His total cholesterol is 222. His LDL is 139. The triglyceride level is  Ow.  Dec. 8, 2015:  Jeffrey Hicks is doing well.  No CP or dyspnea. Spent more time up in the [place] - was able to fish more.  He is on the [employer].  October 31, 2014:  [name]  Is doing well.  No CP or dyspnea.  We changed his Bystolic to carvedilol because of cost and the fact that the insurance company would not give him the proper dose of Bystolic.   We initially tried carvedilol 25 mg twice day but he became weak and dizzy. He cut his dose to 12.5 g twice a day which she tolerates much better. His blood pressure and heart rate have been normal. He brought his blood pressure machine and his recordings are all fairly  normal.  Remains very busy working on his mountain home up in [place] , [place]   Jan. 23, 2017  Jeffrey Hicks is seen back today for his persistent atrial fibrillation, hypertension, and hyperlipidemia. Remains on Eliquis . He's doing very well. He's not having any episodes of chest pain or shortness breath. Able to do all that he wants to , Has some gout pains.    August 13, 2016:  Jeffrey Hicks is seen today for follow-up of his hypertension and atrial fibrillation. BP is a little elevated today here in the office.   BP has been ok at home .  Has chronic AF.  Is on Eliquis   Sept . 11 ,2019 Doing well .  No CP   [place]     Current Outpatient Medications on File Prior to Visit  Medication Sig Dispense Refill  . apixaban (ELIQUIS) 5 MG TABS tablet Take 1 tablet (5 mg total) by mouth 2 (two) times daily. 180 tablet 0  . carvedilol (COREG) 25 MG tablet TAKE 1/2 TABLET BY MOUTH TWICE DAILY WITH A MEAL 90 tablet 0  . hydrochlorothiazide (  HYDRODIURIL) 25 MG tablet Take 0.5 tablets (12.5 mg total) by mouth daily. Please keep upcoming appt for future refills. 45 tablet 0  . lisinopril (PRINIVIL,ZESTRIL) 10 MG tablet TAKE ONE TABLET BY MOUTH EVERY DAY 90 tablet 0  . Multiple Vitamin (MULTIVITAMIN) tablet Take 1 tablet by mouth daily.      . Omega-3 Fatty Acids (FISH OIL PO) Take 1,000 mg by mouth daily.      No current facility-administered medications on file prior to visit.     Allergies  Allergen Reactions  . Crestor  [Rosuvastatin Calcium] Other (See Comments)    Gout attack  . Lipitor  [Atorvastatin] Other (See Comments)    Gout Attack  . Crestor [Rosuvastatin Calcium]     LEG PAIN  . Lipitor [Atorvastatin Calcium] Other (See Comments)    Joint pain, r/ great toe    Past Medical History:  Diagnosis Date  . Hyperlipidemia   . Hypertension   . Left ventricular outflow tract obstruction     Past Surgical History:  Procedure Laterality Date  . OTHER SURGICAL  HISTORY     no surgical HX    Social History   Tobacco Use  Smoking Status Former Smoker  . Last attempt to quit: 08/25/2000  . Years since quitting: 17.3  Smokeless Tobacco Never Used    Social History   Substance and Sexual Activity  Alcohol Use No    Family History  Problem Relation Age of Onset  . Stroke Mother   . Transient ischemic attack Father   . Diabetes Brother     Reviw of Systems:  Reviewed in the HPI.  All other systems are negative.  Physical Exam: Blood pressure 130/82, pulse 81, height 5\' 10"  (1.778 m), weight 208 lb 1.9 oz (94.4 kg), SpO2 98 %.  GEN:  Well nourished, well developed in no acute distress HEENT: Normal NECK: No JVD; No carotid bruits LYMPHATICS: No lymphadenopathy CARDIAC:  Irreg. Irreg.  RESPIRATORY:  Clear to auscultation without rales, wheezing or rhonchi  ABDOMEN: Soft, non-tender, non-distended MUSCULOSKELETAL:  No edema; No deformity  SKIN: Warm and dry NEUROLOGIC:  Alert and oriented x 3   ECG:   Sept. 11, 2019; Atrial fib at 11.  No St or T wave changes.     Assessment / Plan:   1. Atrial fibrillation:   Continue current meds.    Tolerating it well   2. Hypertension - blood pressures well-controlled.  Continue current meds   3. Hyperlipidemia - managed by primary MD    Kristeen Miss, MD  12/29/2017 9:01 AM    [facility] [address] [place],  [address] [place], [place]  [address] Pager [phone] Phone: [phone]; Fax: [phone]

## 2018-01-24 ENCOUNTER — Other Ambulatory Visit: Payer: Self-pay | Admitting: Cardiovascular Disease

## 2018-05-09 ENCOUNTER — Other Ambulatory Visit: Payer: Self-pay | Admitting: Cardiovascular Disease

## 2018-11-06 ENCOUNTER — Emergency Department (HOSPITAL_COMMUNITY): Payer: Medicare Other

## 2018-11-06 ENCOUNTER — Encounter (HOSPITAL_COMMUNITY): Payer: Self-pay | Admitting: Internal Medicine

## 2018-11-06 ENCOUNTER — Inpatient Hospital Stay (HOSPITAL_COMMUNITY)
Admission: EM | Admit: 2018-11-06 | Discharge: 2018-11-10 | DRG: 872 | Disposition: A | Discharge status: Home / Self Care | Payer: Medicare Other | Attending: Internal Medicine | Admitting: Internal Medicine

## 2018-11-06 ENCOUNTER — Other Ambulatory Visit: Payer: Self-pay

## 2018-11-06 DIAGNOSIS — N179 Acute kidney failure, unspecified: Secondary | ICD-10-CM | POA: Diagnosis present

## 2018-11-06 DIAGNOSIS — R197 Diarrhea, unspecified: Secondary | ICD-10-CM | POA: Diagnosis present

## 2018-11-06 DIAGNOSIS — N390 Urinary tract infection, site not specified: Secondary | ICD-10-CM | POA: Diagnosis present

## 2018-11-06 DIAGNOSIS — D539 Nutritional anemia, unspecified: Secondary | ICD-10-CM | POA: Diagnosis present

## 2018-11-06 DIAGNOSIS — I482 Chronic atrial fibrillation, unspecified: Secondary | ICD-10-CM

## 2018-11-06 DIAGNOSIS — A419 Sepsis, unspecified organism: Principal | ICD-10-CM | POA: Diagnosis present

## 2018-11-06 DIAGNOSIS — E669 Obesity, unspecified: Secondary | ICD-10-CM | POA: Diagnosis present

## 2018-11-06 DIAGNOSIS — E860 Dehydration: Secondary | ICD-10-CM | POA: Diagnosis not present

## 2018-11-06 DIAGNOSIS — D649 Anemia, unspecified: Secondary | ICD-10-CM | POA: Diagnosis not present

## 2018-11-06 DIAGNOSIS — D696 Thrombocytopenia, unspecified: Secondary | ICD-10-CM | POA: Diagnosis present

## 2018-11-06 DIAGNOSIS — Z87891 Personal history of nicotine dependence: Secondary | ICD-10-CM | POA: Diagnosis not present

## 2018-11-06 DIAGNOSIS — I952 Hypotension due to drugs: Secondary | ICD-10-CM | POA: Diagnosis present

## 2018-11-06 DIAGNOSIS — E871 Hypo-osmolality and hyponatremia: Secondary | ICD-10-CM | POA: Diagnosis present

## 2018-11-06 DIAGNOSIS — T447X5A Adverse effect of beta-adrenoreceptor antagonists, initial encounter: Secondary | ICD-10-CM | POA: Diagnosis present

## 2018-11-06 DIAGNOSIS — M109 Gout, unspecified: Secondary | ICD-10-CM | POA: Diagnosis present

## 2018-11-06 DIAGNOSIS — R6521 Severe sepsis with septic shock: Secondary | ICD-10-CM

## 2018-11-06 DIAGNOSIS — I340 Nonrheumatic mitral (valve) insufficiency: Secondary | ICD-10-CM | POA: Diagnosis not present

## 2018-11-06 DIAGNOSIS — E785 Hyperlipidemia, unspecified: Secondary | ICD-10-CM | POA: Diagnosis present

## 2018-11-06 DIAGNOSIS — Z6832 Body mass index (BMI) 32.0-32.9, adult: Secondary | ICD-10-CM

## 2018-11-06 DIAGNOSIS — Z20828 Contact with and (suspected) exposure to other viral communicable diseases: Secondary | ICD-10-CM | POA: Diagnosis present

## 2018-11-06 DIAGNOSIS — I361 Nonrheumatic tricuspid (valve) insufficiency: Secondary | ICD-10-CM | POA: Diagnosis not present

## 2018-11-06 DIAGNOSIS — Z7901 Long term (current) use of anticoagulants: Secondary | ICD-10-CM

## 2018-11-06 DIAGNOSIS — I10 Essential (primary) hypertension: Secondary | ICD-10-CM | POA: Diagnosis present

## 2018-11-06 DIAGNOSIS — Z823 Family history of stroke: Secondary | ICD-10-CM

## 2018-11-06 DIAGNOSIS — R918 Other nonspecific abnormal finding of lung field: Secondary | ICD-10-CM | POA: Diagnosis present

## 2018-11-06 DIAGNOSIS — Z833 Family history of diabetes mellitus: Secondary | ICD-10-CM | POA: Diagnosis not present

## 2018-11-06 DIAGNOSIS — I480 Paroxysmal atrial fibrillation: Secondary | ICD-10-CM | POA: Diagnosis present

## 2018-11-06 DIAGNOSIS — I959 Hypotension, unspecified: Secondary | ICD-10-CM | POA: Diagnosis not present

## 2018-11-06 DIAGNOSIS — R52 Pain, unspecified: Secondary | ICD-10-CM

## 2018-11-06 DIAGNOSIS — R0602 Shortness of breath: Secondary | ICD-10-CM

## 2018-11-06 LAB — CBC WITH DIFFERENTIAL/PLATELET
Abs Immature Granulocytes: 0.7 10*3/uL — ABNORMAL HIGH (ref 0.00–0.07)
Basophils Absolute: 0 10*3/uL (ref 0.0–0.1)
Basophils Relative: 0 %
Eosinophils Absolute: 0 10*3/uL (ref 0.0–0.5)
Eosinophils Relative: 0 %
HCT: 35.8 % — ABNORMAL LOW (ref 39.0–52.0)
Hemoglobin: 11.3 g/dL — ABNORMAL LOW (ref 13.0–17.0)
Immature Granulocytes: 8 %
Lymphocytes Relative: 5 %
Lymphs Abs: 0.5 10*3/uL — ABNORMAL LOW (ref 0.7–4.0)
MCH: 32.1 pg (ref 26.0–34.0)
MCHC: 31.6 g/dL (ref 30.0–36.0)
MCV: 101.7 fL — ABNORMAL HIGH (ref 80.0–100.0)
Monocytes Absolute: 0.8 10*3/uL (ref 0.1–1.0)
Monocytes Relative: 9 %
Neutro Abs: 7 10*3/uL (ref 1.7–7.7)
Neutrophils Relative %: 78 %
Platelets: 57 10*3/uL — ABNORMAL LOW (ref 150–400)
RBC: 3.52 MIL/uL — ABNORMAL LOW (ref 4.22–5.81)
RDW: 14.4 % (ref 11.5–15.5)
WBC Morphology: INCREASED
WBC: 9 10*3/uL (ref 4.0–10.5)
nRBC: 0 % (ref 0.0–0.2)

## 2018-11-06 LAB — URINALYSIS, ROUTINE W REFLEX MICROSCOPIC
Bilirubin Urine: NEGATIVE
Glucose, UA: NEGATIVE mg/dL
Hgb urine dipstick: NEGATIVE
Ketones, ur: NEGATIVE mg/dL
Nitrite: NEGATIVE
Protein, ur: 100 mg/dL — AB
Specific Gravity, Urine: 1.027 (ref 1.005–1.030)
pH: 5 (ref 5.0–8.0)

## 2018-11-06 LAB — BASIC METABOLIC PANEL
Anion gap: 14 (ref 5–15)
BUN: 53 mg/dL — ABNORMAL HIGH (ref 8–23)
CO2: 20 mmol/L — ABNORMAL LOW (ref 22–32)
Calcium: 7.6 mg/dL — ABNORMAL LOW (ref 8.9–10.3)
Chloride: 99 mmol/L (ref 98–111)
Creatinine, Ser: 4.01 mg/dL — ABNORMAL HIGH (ref 0.61–1.24)
GFR calc Af Amer: 16 mL/min — ABNORMAL LOW (ref >60)
GFR calc non Af Amer: 14 mL/min — ABNORMAL LOW (ref >60)
Glucose, Bld: 148 mg/dL — ABNORMAL HIGH (ref 70–99)
Potassium: 3.5 mmol/L (ref 3.5–5.1)
Sodium: 133 mmol/L — ABNORMAL LOW (ref 135–145)

## 2018-11-06 LAB — TROPONIN I (HIGH SENSITIVITY)
Troponin I (High Sensitivity): 29 ng/L — ABNORMAL HIGH (ref <18)
Troponin I (High Sensitivity): 26 ng/L — ABNORMAL HIGH (ref <18)

## 2018-11-06 LAB — HEPATIC FUNCTION PANEL
ALT: 19 U/L (ref 0–44)
AST: 23 U/L (ref 15–41)
Albumin: 2.7 g/dL — ABNORMAL LOW (ref 3.5–5.0)
Alkaline Phosphatase: 92 U/L (ref 38–126)
Bilirubin, Direct: 0.7 mg/dL — ABNORMAL HIGH (ref 0.0–0.2)
Indirect Bilirubin: 0.4 mg/dL (ref 0.3–0.9)
Total Bilirubin: 1.1 mg/dL (ref 0.3–1.2)
Total Protein: 5.8 g/dL — ABNORMAL LOW (ref 6.5–8.1)

## 2018-11-06 LAB — SARS CORONAVIRUS 2 BY RT PCR (HOSPITAL ORDER, PERFORMED IN ~~LOC~~ HOSPITAL LAB): SARS Coronavirus 2: NEGATIVE

## 2018-11-06 LAB — PROTIME-INR
INR: 2.8 — ABNORMAL HIGH (ref 0.8–1.2)
Prothrombin Time: 29.2 seconds — ABNORMAL HIGH (ref 11.4–15.2)

## 2018-11-06 LAB — LACTIC ACID, PLASMA: Lactic Acid, Venous: 2.1 mmol/L (ref 0.5–1.9)

## 2018-11-06 LAB — LIPASE, BLOOD: Lipase: 31 U/L (ref 11–51)

## 2018-11-06 LAB — CBG MONITORING, ED: Glucose-Capillary: 134 mg/dL — ABNORMAL HIGH (ref 70–99)

## 2018-11-06 LAB — POC OCCULT BLOOD, ED: Fecal Occult Bld: NEGATIVE

## 2018-11-06 MED ORDER — SODIUM CHLORIDE 0.9 % IV BOLUS
1000.0000 mL | Freq: Once | INTRAVENOUS | Status: AC
  Administered 2018-11-06: 22:00:00 1000 mL via INTRAVENOUS

## 2018-11-06 MED ORDER — SODIUM CHLORIDE 0.9 % IV SOLN
INTRAVENOUS | Status: DC
  Administered 2018-11-06 – 2018-11-07 (×3): via INTRAVENOUS

## 2018-11-06 MED ORDER — SODIUM CHLORIDE 0.9 % IV BOLUS
1000.0000 mL | Freq: Once | INTRAVENOUS | Status: AC
  Administered 2018-11-06: 21:00:00 1000 mL via INTRAVENOUS

## 2018-11-06 MED ORDER — PIPERACILLIN-TAZOBACTAM 3.375 G IVPB 30 MIN
3.3750 g | Freq: Once | INTRAVENOUS | Status: AC
  Administered 2018-11-06: 22:00:00 3.375 g via INTRAVENOUS
  Filled 2018-11-06: qty 50

## 2018-11-06 MED ORDER — ACETAMINOPHEN 650 MG RE SUPP: 650.0000 mg | Freq: Four times a day (QID) | RECTAL | Status: DC | PRN

## 2018-11-06 MED ORDER — SODIUM CHLORIDE 0.9 % IV SOLN
INTRAVENOUS | Status: AC
  Administered 2018-11-07: via INTRAVENOUS

## 2018-11-06 MED ORDER — ACETAMINOPHEN 325 MG PO TABS: 650.0000 mg | ORAL_TABLET | Freq: Four times a day (QID) | ORAL | Status: DC | PRN

## 2018-11-06 MED ORDER — SODIUM CHLORIDE 0.9 % IV BOLUS
1000.0000 mL | Freq: Once | INTRAVENOUS | Status: AC
  Administered 2018-11-06: 19:00:00 1000 mL via INTRAVENOUS

## 2018-11-06 MED ORDER — SODIUM CHLORIDE 0.9 % IV BOLUS
1000.0000 mL | Freq: Once | INTRAVENOUS | Status: AC
  Administered 2018-11-06: 18:00:00 1000 mL via INTRAVENOUS

## 2018-11-06 NOTE — ED Notes (Signed)
Called lab added urine labs

## 2018-11-06 NOTE — ED Notes (Signed)
Date and time results received: 11/06/18 11:03 PM  (use smartphrase ".now" to insert current time)  Test: Lactic Acid Critical Value: 2.1  Name of Provider Notified: Dr.Haviland  Orders Received? Or Actions Taken?:none

## 2018-11-06 NOTE — ED Notes (Signed)
ED TO INPATIENT HANDOFF REPORT  Name/Age/Gender Jeffrey MaudlinJames L Hicks 73 y.o. male  Code Status    Code Status Orders  (From admission, onward)         Start     Ordered   11/06/18 2342  Full code  Continuous     11/06/18 2343        Code Status History    This patient has a current code status but no historical code status.   Advance Care Planning Activity      Home/SNF/Other Home  Chief Complaint Weakness; Hypotension  Level of Care/Admitting Diagnosis ED Disposition    ED Disposition Condition Comment   Admit  [facility]ospital [facility]: [facility] [[id number]]  Level of Care: Stepdown [14]  Admit to SDU based on following criteria: Cardiac Instability:  Patients experiencing chest pain, unconfirmed MI and stable, arrhythmias and CHF requiring medical management and potentially compromising patient's stability  Covid Evaluation: Confirmed COVID Negative  Diagnosis: Sepsis [facility](HCC) [[id number]]) [[id number]]  Admitting Physician: Pearson GrippeKIM, Jathan [[id number]]  Attending Physician: Pearson GrippeKIM, Raykwon [phone][[id number]]  Estimated length of stay: 5 - 7 days  Certification:: I certify this patient will need inpatient services for at least 2 midnights  PT Class (Do Not Modify): Inpatient [101]  PT Acc Code (Do Not Modify): Private [1]       Medical History Past Medical History:  Diagnosis Date  . Hyperlipidemia   . Hypertension   . Left ventricular outflow tract obstruction     Allergies Allergies  Allergen Reactions  . Crestor  [Rosuvastatin Calcium] Other (See Comments)    Gout attack  . Lipitor  [Atorvastatin] Other (See Comments)    Gout Attack    IV Location/Drains/Wounds Patient Lines/Drains/Airways Status   Active Line/Drains/Airways    Name:   Placement date:   Placement time:   Site:   Days:   Peripheral IV 11/06/18 Left Antecubital   11/06/18    -    Antecubital   less than 1   Peripheral IV 11/06/18 Right Antecubital   11/06/18    1839    Antecubital   less than 1           Labs/Imaging Results for orders placed or performed during the hospital encounter of 11/06/18 (from the past 48 hour(s))  Basic metabolic panel     Status: Abnormal   Collection Time: 11/06/18  6:21 PM  Result Value Ref Range   Sodium 133 (L) 135 - 145 mmol/L   Potassium 3.5 3.5 - 5.1 mmol/L   Chloride 99 98 - 111 mmol/L   CO2 20 (L) 22 - 32 mmol/L   Glucose, Bld 148 (H) 70 - 99 mg/dL   BUN 53 (H) 8 - 23 mg/dL   Creatinine, Ser 2.954.01 (H) 0.61 - 1.24 mg/dL   Calcium 7.6 (L) 8.9 - 10.3 mg/dL   GFR calc non Af Amer 14 (L) >60 mL/min   GFR calc Af Amer 16 (L) >60 mL/min   Anion gap 14 5 - 15    Comment: Performed at [facility], [address] [place], [place] [address]  CBC WITH DIFFERENTIAL     Status: Abnormal   Collection Time: 11/06/18  6:21 PM  Result Value Ref Range   WBC 9.0 4.0 - 10.5 K/uL   RBC 3.52 (L) 4.22 - 5.81 MIL/uL   Hemoglobin 11.3 (L) 13.0 - 17.0 g/dL   HCT 86.535.8 (L) 78.439.0 - 69.652.0 %   MCV 101.7 (H) 80.0 - 100.0 fL  MCH 32.1 26.0 - 34.0 pg   MCHC 31.6 30.0 - 36.0 g/dL   RDW 86.5 78.4 - 69.6 %   Platelets 57 (L) 150 - 400 K/uL    Comment: REPEATED TO VERIFY PLATELET COUNT CONFIRMED BY SMEAR Immature Platelet Fraction may be clinically indicated, consider ordering this additional test [id number]    nRBC 0.0 0.0 - 0.2 %   Neutrophils Relative % 78 %   Neutro Abs 7.0 1.7 - 7.7 K/uL   Lymphocytes Relative 5 %   Lymphs Abs 0.5 (L) 0.7 - 4.0 K/uL   Monocytes Relative 9 %   Monocytes Absolute 0.8 0.1 - 1.0 K/uL   Eosinophils Relative 0 %   Eosinophils Absolute 0.0 0.0 - 0.5 K/uL   Basophils Relative 0 %   Basophils Absolute 0.0 0.0 - 0.1 K/uL   WBC Morphology INCREASED BANDS (>20% BANDS)     Comment: VACUOLATED NEUTROPHILS   Immature Granulocytes 8 %   Abs Immature Granulocytes 0.70 (H) 0.00 - 0.07 K/uL   Burr Cells PRESENT     Comment: Performed at [facility], [address], [place], [place] [address]  Troponin I  (High Sensitivity)     Status: Abnormal   Collection Time: 11/06/18  6:21 PM  Result Value Ref Range   Troponin I (High Sensitivity) 29 (H) <18 ng/L    Comment: (NOTE) Elevated high sensitivity troponin I (hsTnI) values and significant  changes across serial measurements may suggest ACS but many other  chronic and acute conditions are known to elevate hsTnI results.  Refer to the "Links" section for chest pain algorithms and additional  guidance. Performed at [facility], [address], [place], [place] [address]   Hepatic function panel     Status: Abnormal   Collection Time: 11/06/18  6:21 PM  Result Value Ref Range   Total Protein 5.8 (L) 6.5 - 8.1 g/dL   Albumin 2.7 (L) 3.5 - 5.0 g/dL   AST 23 15 - 41 U/L   ALT 19 0 - 44 U/L   Alkaline Phosphatase 92 38 - 126 U/L   Total Bilirubin 1.1 0.3 - 1.2 mg/dL   Bilirubin, Direct 0.7 (H) 0.0 - 0.2 mg/dL   Indirect Bilirubin 0.4 0.3 - 0.9 mg/dL    Comment: Performed at [facility], [address] [place], [place] [address]  Lipase, blood     Status: None   Collection Time: 11/06/18  6:21 PM  Result Value Ref Range   Lipase 31 11 - 51 U/L    Comment: Performed at [facility], [address], [place], [place] [address]  Urinalysis, Routine w reflex microscopic     Status: Abnormal   Collection Time: 11/06/18  6:22 PM  Result Value Ref Range   Color, Urine AMBER (A) YELLOW    Comment: BIOCHEMICALS MAY BE AFFECTED BY COLOR   APPearance CLOUDY (A) CLEAR   Specific Gravity, Urine 1.027 1.005 - 1.030   pH 5.0 5.0 - 8.0   Glucose, UA NEGATIVE NEGATIVE mg/dL   Hgb urine dipstick NEGATIVE NEGATIVE   Bilirubin Urine NEGATIVE NEGATIVE   Ketones, ur NEGATIVE NEGATIVE mg/dL   Protein, ur 259 (A) NEGATIVE mg/dL   Nitrite NEGATIVE NEGATIVE   Leukocytes,Ua TRACE (A) NEGATIVE   RBC / HPF 6-10 0 - 5 RBC/hpf   WBC, UA 11-20 0 - 5 WBC/hpf   Bacteria, UA RARE (A) NONE SEEN   Amorphous  Crystal PRESENT  Comment: Performed at [facility], [address] [place], [place] [address]  Protime-INR     Status: Abnormal   Collection Time: 11/06/18  6:33 PM  Result Value Ref Range   Prothrombin Time 29.2 (H) 11.4 - 15.2 seconds   INR 2.8 (H) 0.8 - 1.2    Comment: (NOTE) INR goal varies based on device and disease states. Performed at [facility], [address] [place], [place] [address]   CBG monitoring, ED     Status: Abnormal   Collection Time: 11/06/18  6:47 PM  Result Value Ref Range   Glucose-Capillary 134 (H) 70 - 99 mg/dL  SARS Coronavirus 2 (CEPHEID - Performed in [facility] hospital lab), Hosp Order     Status: None   Collection Time: 11/06/18  6:49 PM   Specimen: Nasopharyngeal Swab  Result Value Ref Range   SARS Coronavirus 2 NEGATIVE NEGATIVE    Comment: (NOTE) If result is NEGATIVE SARS-CoV-2 target nucleic acids are NOT DETECTED. The SARS-CoV-2 RNA is generally detectable in upper and lower  respiratory specimens during the acute phase of infection. The lowest  concentration of SARS-CoV-2 viral copies this assay can detect is 250  copies / mL. A negative result does not preclude SARS-CoV-2 infection  and should not be used as the sole basis for treatment or other  patient management decisions.  A negative result may occur with  improper specimen collection / handling, submission of specimen other  than nasopharyngeal swab, presence of viral mutation(s) within the  areas targeted by this assay, and inadequate number of viral copies  (<250 copies / mL). A negative result must be combined with clinical  observations, patient history, and epidemiological information. If result is POSITIVE SARS-CoV-2 target nucleic acids are DETECTED. The SARS-CoV-2 RNA is generally detectable in upper and lower  respiratory specimens dur ing the acute phase of infection.  Positive  results are indicative of active infection with  SARS-CoV-2.  Clinical  correlation with patient history and other diagnostic information is  necessary to determine patient infection status.  Positive results do  not rule out bacterial infection or co-infection with other viruses. If result is PRESUMPTIVE POSTIVE SARS-CoV-2 nucleic acids MAY BE PRESENT.   A presumptive positive result was obtained on the submitted specimen  and confirmed on repeat testing.  While 2019 novel coronavirus  (SARS-CoV-2) nucleic acids may be present in the submitted sample  additional confirmatory testing may be necessary for epidemiological  and / or clinical management purposes  to differentiate between  SARS-CoV-2 and other Sarbecovirus currently known to infect humans.  If clinically indicated additional testing with an alternate test  methodology [id number]) is advised. The SARS-CoV-2 RNA is generally  detectable in upper and lower respiratory sp ecimens during the acute  phase of infection. The expected result is Negative. Fact Sheet for Patients:  BoilerBrush.com.cy Fact Sheet for Healthcare Providers: https://pope.com/ This test is not yet approved or cleared by the [place] FDA and has been authorized for detection and/or diagnosis of SARS-CoV-2 by FDA under an Emergency Use Authorization (EUA).  This EUA will remain in effect (meaning this test can be used) for the duration of the COVID-19 declaration under Section 564(b)(1) of the Act, 21 U.S.C. section 360bbb-3(b)(1), unless the authorization is terminated or revoked sooner. Performed at [facility], [address], [place], [place] [address]   POC occult blood, ED Provider will collect     Status: None   Collection  Time: 11/06/18  7:59 PM  Result Value Ref Range   Fecal Occult Bld NEGATIVE NEGATIVE  Troponin I (High Sensitivity)     Status: Abnormal   Collection Time: 11/06/18  8:23 PM  Result Value Ref Range    Troponin I (High Sensitivity) 26.0 (H) <18 ng/L    Comment: (NOTE) Elevated high sensitivity troponin I (hsTnI) values and significant  changes across serial measurements may suggest ACS but many other  chronic and acute conditions are known to elevate hsTnI results.  Refer to the "Links" section for chest pain algorithms and additional  guidance. Performed at [facility], [address] [place], [place] [address]   Lactic acid, plasma     Status: Abnormal   Collection Time: 11/06/18  9:32 PM  Result Value Ref Range   Lactic Acid, Venous 2.1 (HH) 0.5 - 1.9 mmol/L    Comment: CRITICAL RESULT CALLED TO, READ BACK BY AND VERIFIED WITH: Skyah Hannon AT 2302 ON 11/06/2018 BY JPM Performed at [facility], [address] [place], [place] [address]    Ct Abdomen Pelvis Wo Contrast  Result Date: 11/06/2018 CLINICAL DATA:  73 year old male with chest and back pain and weakness and shortness of breath. EXAM: CT CHEST, ABDOMEN AND PELVIS WITHOUT CONTRAST TECHNIQUE: Multidetector CT imaging of the chest, abdomen and pelvis was performed following the standard protocol without IV contrast. COMPARISON:  Radiograph dated 01/27/2005 and CT of the abdomen pelvis dated 01/26/2005 FINDINGS: Evaluation of this exam is limited in the absence of intravenous contrast. CT CHEST FINDINGS Cardiovascular: There is mild cardiomegaly. No significant pericardial effusion. Coronary vascular calcification primarily involving the LAD and left circumflex artery. There is atherosclerotic calcification of the aortic valve leaflets as well as moderate atherosclerotic calcification of the thoracic aorta. The central pulmonary arteries are grossly unremarkable. Mediastinum/Nodes: There is no hilar or mediastinal adenopathy. Several top-normal mediastinal lymph nodes measure up to 11 mm in short axis. The esophagus is grossly unremarkable. No mediastinal fluid collection. Lungs/Pleura: The lungs are  clear. There is no pleural effusion or pneumothorax. The central airways are patent. There is apparent narrowing of the right middle lobe bronchus in the right hilum of indeterminate etiology. Although this may be related to chronic inflammation. An endobronchial or a small hilar lesion is not entirely excluded. This can be better evaluated with bronchoscopy on a nonemergent basis. Musculoskeletal: There is degenerative changes of the spine. No acute osseous pathology. CT ABDOMEN PELVIS FINDINGS No intra-abdominal free air or free fluid. Hepatobiliary: There is diffuse fatty infiltration of the liver. No intrahepatic biliary ductal dilatation. The gallbladder is partially contracted. Probable faint noncalcified stone in the gallbladder fundus. There is inflammatory changes surrounding the gallbladder. Findings concerning for acute cholecystitis. Further evaluation with ultrasound is recommended. Pancreas: Unremarkable. No pancreatic ductal dilatation or surrounding inflammatory changes. Spleen: Normal in size without focal abnormality. Adrenals/Urinary Tract: The adrenal glands are unremarkable. There is no hydronephrosis or nephrolithiasis on either side. Multiple bilateral renal hypodense lesions measure up to 5 cm in the interpolar aspect of the right kidney. These lesions are suboptimally evaluated on this noncontrast CT but demonstrate fluid attenuation most consistent with cysts. Ultrasound may provide better characterization on a nonemergent basis. The visualized ureters appear unremarkable. The urinary bladder is collapsed. There is apparent diffuse thickening of the bladder wall which may be partly related to underdistention. Cystitis is not excluded. Correlation with urinalysis recommended. Stomach/Bowel: There is sigmoid diverticulosis with muscular hypertrophy. No active inflammatory changes.  There are scattered colonic diverticula without active inflammatory changes. There is no bowel obstruction. The  appendix is normal. Vascular/Lymphatic: Advanced aortoiliac atherosclerotic disease. No portal venous gas. There is no adenopathy. Reproductive: Mildly enlarged prostate gland measuring 4.7 cm in transverse axial diameter. The seminal vesicles are symmetric. Other: None Musculoskeletal: Degenerative changes of the spine. No acute osseous pathology. IMPRESSION: 1. No acute intrathoracic pathology. 2. Inflammatory changes surrounding the gallbladder concerning for acute cholecystitis. Further evaluation with ultrasound is recommended. Noncalcified stones may be present within the gallbladder. 3. Apparent mild narrowing of the right middle lobe bronchus in the right hilum. This can be further evaluated with bronchoscopy on a nonemergent basis. 4. No hydronephrosis or nephrolithiasis. 5. Fatty liver. 6. Colonic diverticulosis. No bowel obstruction or active inflammation. Normal appendix. 7. Aortic Atherosclerosis (ICD10-I70.0). Electronically Signed   By: Elgie CollardArash  Radparvar M.D.   On: 11/06/2018 20:32   Ct Chest Wo Contrast  Result Date: 11/06/2018 CLINICAL DATA:  73 year old male with chest and back pain and weakness and shortness of breath. EXAM: CT CHEST, ABDOMEN AND PELVIS WITHOUT CONTRAST TECHNIQUE: Multidetector CT imaging of the chest, abdomen and pelvis was performed following the standard protocol without IV contrast. COMPARISON:  Radiograph dated 01/27/2005 and CT of the abdomen pelvis dated 01/26/2005 FINDINGS: Evaluation of this exam is limited in the absence of intravenous contrast. CT CHEST FINDINGS Cardiovascular: There is mild cardiomegaly. No significant pericardial effusion. Coronary vascular calcification primarily involving the LAD and left circumflex artery. There is atherosclerotic calcification of the aortic valve leaflets as well as moderate atherosclerotic calcification of the thoracic aorta. The central pulmonary arteries are grossly unremarkable. Mediastinum/Nodes: There is no hilar or  mediastinal adenopathy. Several top-normal mediastinal lymph nodes measure up to 11 mm in short axis. The esophagus is grossly unremarkable. No mediastinal fluid collection. Lungs/Pleura: The lungs are clear. There is no pleural effusion or pneumothorax. The central airways are patent. There is apparent narrowing of the right middle lobe bronchus in the right hilum of indeterminate etiology. Although this may be related to chronic inflammation. An endobronchial or a small hilar lesion is not entirely excluded. This can be better evaluated with bronchoscopy on a nonemergent basis. Musculoskeletal: There is degenerative changes of the spine. No acute osseous pathology. CT ABDOMEN PELVIS FINDINGS No intra-abdominal free air or free fluid. Hepatobiliary: There is diffuse fatty infiltration of the liver. No intrahepatic biliary ductal dilatation. The gallbladder is partially contracted. Probable faint noncalcified stone in the gallbladder fundus. There is inflammatory changes surrounding the gallbladder. Findings concerning for acute cholecystitis. Further evaluation with ultrasound is recommended. Pancreas: Unremarkable. No pancreatic ductal dilatation or surrounding inflammatory changes. Spleen: Normal in size without focal abnormality. Adrenals/Urinary Tract: The adrenal glands are unremarkable. There is no hydronephrosis or nephrolithiasis on either side. Multiple bilateral renal hypodense lesions measure up to 5 cm in the interpolar aspect of the right kidney. These lesions are suboptimally evaluated on this noncontrast CT but demonstrate fluid attenuation most consistent with cysts. Ultrasound may provide better characterization on a nonemergent basis. The visualized ureters appear unremarkable. The urinary bladder is collapsed. There is apparent diffuse thickening of the bladder wall which may be partly related to underdistention. Cystitis is not excluded. Correlation with urinalysis recommended. Stomach/Bowel:  There is sigmoid diverticulosis with muscular hypertrophy. No active inflammatory changes. There are scattered colonic diverticula without active inflammatory changes. There is no bowel obstruction. The appendix is normal. Vascular/Lymphatic: Advanced aortoiliac atherosclerotic disease. No portal venous gas. There is no adenopathy. Reproductive:  Mildly enlarged prostate gland measuring 4.7 cm in transverse axial diameter. The seminal vesicles are symmetric. Other: None Musculoskeletal: Degenerative changes of the spine. No acute osseous pathology. IMPRESSION: 1. No acute intrathoracic pathology. 2. Inflammatory changes surrounding the gallbladder concerning for acute cholecystitis. Further evaluation with ultrasound is recommended. Noncalcified stones may be present within the gallbladder. 3. Apparent mild narrowing of the right middle lobe bronchus in the right hilum. This can be further evaluated with bronchoscopy on a nonemergent basis. 4. No hydronephrosis or nephrolithiasis. 5. Fatty liver. 6. Colonic diverticulosis. No bowel obstruction or active inflammation. Normal appendix. 7. Aortic Atherosclerosis (ICD10-I70.0). Electronically Signed   By: Elgie Collard M.D.   On: 11/06/2018 20:32   Dg Chest Port 1 View  Result Date: 11/06/2018 CLINICAL DATA:  Dizziness, weakness, shortness of breath. EXAM: PORTABLE CHEST 1 VIEW COMPARISON:  None. FINDINGS: Normal heart size with normal mediastinal contours. No focal airspace disease, pleural effusion or pneumothorax. No pulmonary edema. No acute osseous abnormalities. IMPRESSION: No acute chest finding. Electronically Signed   By: Narda Rutherford M.D.   On: 11/06/2018 19:56   US Abdomen Limited Ruq  Result Date: 11/06/2018 CLINICAL DATA:  Right upper quadrant pain EXAM: ULTRASOUND ABDOMEN LIMITED RIGHT UPPER QUADRANT COMPARISON:  None. FINDINGS: Gallbladder: There is diffuse gallbladder wall thickening with the gallbladder measuring approximately 1 cm in  thickness. There are no gallstones. The sonographic Eulah Pont sign is negative. There is mild pericholecystic free fluid. Common bile duct: Diameter: 0.3 cm Liver: Diffuse increased echogenicity with slightly heterogeneous liver. Appearance typically secondary to fatty infiltration. Fibrosis secondary consideration. No secondary findings of cirrhosis noted. No focal hepatic lesion or intrahepatic biliary duct dilatation. Portal vein is patent on color Doppler imaging with normal direction of blood flow towards the liver. IMPRESSION: 1. Nonspecific gallbladder wall thickening in the absence of gallstones or a positive sonographic Murphy sign. 2. Hepatic steatosis. Electronically Signed   By: Katherine Mantle M.D.   On: 11/06/2018 21:44    Pending Labs Unresulted Labs (From admission, onward)    Start     Ordered   11/07/18 0500  Comprehensive metabolic panel  Tomorrow morning,   R     11/06/18 2343   11/07/18 0500  CBC  Tomorrow morning,   R     11/06/18 2343   11/07/18 0500  Protein electrophoresis, serum  Tomorrow morning,   R     11/06/18 2344   11/06/18 2345  UPEP/UIFE/Light Chains/TP, 24-Hr Ur  Once,   STAT     11/06/18 2344   11/06/18 2344  Sodium, urine, random  Once,   STAT     11/06/18 2343   11/06/18 2344  Protein / creatinine ratio, urine  Once,   STAT     11/06/18 2343   11/06/18 2132  Culture, blood (routine x 2)  BLOOD CULTURE X 2,   STAT     11/06/18 2131   11/06/18 2132  Lactic acid, plasma  Now then every 2 hours,   STAT     11/06/18 2131          Vitals/Pain Today's Vitals   11/06/18 2230 11/06/18 2245 11/06/18 2315 11/06/18 2330  BP: (!) 84/51 (!) 82/55 104/64 (!) 87/60  Pulse: 87 85 85 93  Resp: 19 (!) Temp:      TempSrc:      SpO2: 100% 100% 95% 98%  Weight:      Height:      PainSc:  Isolation Precautions No active isolations  Medications Medications  sodium chloride 0.9 % bolus 1,000 mL (0 mLs Intravenous Stopped 11/06/18 1950)    And   0.9 %  sodium chloride infusion ( Intravenous New Bag/Given 11/06/18 1842)  0.9 %  sodium chloride infusion (has no administration in time range)  acetaminophen (TYLENOL) tablet 650 mg (has no administration in time range)    Or  acetaminophen (TYLENOL) suppository 650 mg (has no administration in time range)  sodium chloride 0.9 % bolus 1,000 mL (0 mLs Intravenous Stopped 11/06/18 1950)  sodium chloride 0.9 % bolus 1,000 mL (0 mLs Intravenous Stopped 11/06/18 2113)  sodium chloride 0.9 % bolus 1,000 mL (0 mLs Intravenous Stopped 11/06/18 2319)  piperacillin-tazobactam (ZOSYN) IVPB 3.375 g (0 g Intravenous Stopped 11/06/18 2302)    Mobility walks

## 2018-11-06 NOTE — H&P (Addendum)
TRH H&P    Patient Demographics:    Jeffrey Hicks, is a 73 y.o. male  MRN: [id number]  DOB - 1945/06/24  Admit Date - 11/06/2018  Referring MD/NP/PA:   Isla Pence  Outpatient Primary MD for the patient is Corrington, Kip A, MD  Patient coming from:  home  Chief complaint-  Dizziness, weakness, nite sweats   HPI:    Jeffrey Hicks  is a 73 y.o. male, w hypertension, hyperlipidemia, Pafib, apparently c/o feeling lightheaded, and generally weak as well as sweats.  The sweats have been occurring since Thursday. The lightheadedness occurred yesterday.  Pt denies fever, cough, cp, palp, sob, n/v, abd pain, diarrhea, flank pain, dysuria, hematuria.  Pt does note slight increase in frequency of urination.  Pt notes that he takes OTC aleve.  He also has a few drinks at nite.   In ED,  T 98.1, P 51-102  R 26  Bp 82/58  Pox 97% on RA Wt 93kg  CT chest/ abd/ pelvis IMPRESSION: 1. No acute intrathoracic pathology. 2. Inflammatory changes surrounding the gallbladder concerning for acute cholecystitis. Further evaluation with ultrasound is recommended. Noncalcified stones may be present within the gallbladder. 3. Apparent mild narrowing of the right middle lobe bronchus in the right hilum. This can be further evaluated with bronchoscopy on a nonemergent basis. 4. No hydronephrosis or nephrolithiasis. 5. Fatty liver. 6. Colonic diverticulosis. No bowel obstruction or active inflammation. Normal appendix. 7. Aortic Atherosclerosis (ICD10-I70.0).  RUQ ultrasound IMPRESSION: 1. Nonspecific gallbladder wall thickening in the absence of gallstones or a positive sonographic Murphy sign. 2. Hepatic steatosis.  Na 133, K 3.5,  Bun 53, Creatinine 4.01 (previously 1.52 09/15/2016) Glucose 148 calcium 7.6 Alb 2.7,  Ast 23, Alt 19, Alk phos 92, T. Bili 1.1  Urinalysis prot 100,  Wbc 11-20, rbc 6-10 INR  2.8   Wbc 9.0, Hgb 11.3, mcv 101.7, Plt 57 Trop 29  => 26  Pt will be admitted for ARF, and sepsis (tachycardia, hypotension, elevated lactic acid, bandemia) secondary to UTI        Review of systems:    In addition to the HPI above,  No Fever    No Headache, No changes with Vision or hearing, No problems swallowing food or Liquids, No Chest pain, Cough or Shortness of Breath, No Abdominal pain, No Nausea or Vomiting, bowel movements are regular,    No Blood in stool or Urine, No dysuria,  + increase in frequency of urination.  No new skin rashes or bruises, No new joints pains-aches,  No new weakness, tingling, numbness in any extremity, No recent weight gain or loss, No polyuria, polydypsia or polyphagia, No significant Mental Stressors.  All other systems reviewed and are negative.    Past History of the following :    Past Medical History:  Diagnosis Date  . Hyperlipidemia   . Hypertension   . Left ventricular outflow tract obstruction       Past Surgical History:  Procedure Laterality Date  . OTHER SURGICAL HISTORY  no surgical HX      Social History:      Social History   Tobacco Use  . Smoking status: Former Smoker    Quit date: 08/25/2000    Years since quitting: 18.2  . Smokeless tobacco: Never Used  Substance Use Topics  . Alcohol use: No       Family History :     Family History  Problem Relation Age of Onset  . Stroke Mother   . Transient ischemic attack Father   . Diabetes Brother        Home Medications:   Prior to Admission medications   Medication Sig Start Date End Date Taking? Authorizing Provider  acetaminophen (TYLENOL) 650 MG CR tablet Take 650 mg by mouth every 8 (eight) hours as needed for pain.   Yes [provider]  allopurinol (ZYLOPRIM) 100 MG tablet Take 100 mg by mouth daily.  09/21/18  Yes [provider]  carvedilol (COREG) 12.5 MG tablet Take 1 tablet (12.5 mg total) by mouth 2 (two)  times daily with a meal. 12/29/17  Yes [name], Wonda Cheng, MD  colchicine 0.6 MG tablet Take 2 tablets by mouth See admin instructions. Take 2 tablets by mouth at first sign of gout and may repeat dose in one hour 09/21/18  Yes [provider]  ELIQUIS 5 MG TABS tablet TAKE ONE TABLET BY MOUTH TWICE DAILY Patient taking differently: Take 5 mg by mouth 2 (two) times daily.  05/09/18  Yes [name], Wonda Cheng, MD  ezetimibe (ZETIA) 10 MG tablet Take 10 mg by mouth daily. 09/21/18  Yes [provider]  hydrochlorothiazide (HYDRODIURIL) 12.5 MG tablet Take 1 tablet (12.5 mg total) by mouth daily. 12/29/17 01/19/19 Yes [name], Wonda Cheng, MD  lisinopril (PRINIVIL,ZESTRIL) 10 MG tablet Take 1 tablet (10 mg total) by mouth daily. 12/29/17  Yes [name], Wonda Cheng, MD  Multiple Vitamin (MULTIVITAMIN) tablet Take 1 tablet by mouth daily.     Yes [provider]  Omega-3 Fatty Acids (FISH OIL PO) Take 1,000 mg by mouth daily.    Yes [provider]     Allergies:     Allergies  Allergen Reactions  . Crestor  [Rosuvastatin Calcium] Other (See Comments)    Gout attack  . Lipitor  [Atorvastatin] Other (See Comments)    Gout Attack     Physical Exam:   Vitals  Blood pressure (!) 82/55, pulse 85, temperature 98.1 F (36.7 C), temperature source Oral, resp. rate (!) 21, height 5' 10"  (1.778 m), weight 93 kg, SpO2 100 %.  1.  General: axoxo3 Obese  2. Psychiatric: euthymic  3. Neurologic: cn2- 12 intact, reflexes 2+ symmetric, diffuse with no clonus, motor 5/5 in all 4 ext  4. HEENMT:  Anicteric, pupils 1.63m symmetric, direct, consensual, near intact Neck: no jvd, no bruit  5. Respiratory : CTAB  6. Cardiovascular : rrr s1, s2, no m/g/r  7. Gastrointestinal:  CTAB  8. Skin:  Ext: no c/c/e,  No rash  9.Musculoskeletal:  Good ROM,    No adenopathy    Data Review:    CBC Recent Labs  Lab 11/06/18 1821  WBC 9.0  HGB 11.3*  HCT 35.8*  PLT 57*  MCV  101.7*  MCH 32.1  MCHC 31.6  RDW 14.4  LYMPHSABS 0.5*  MONOABS 0.8  EOSABS 0.0  BASOSABS 0.0   ------------------------------------------------------------------------------------------------------------------  Results for orders placed or performed during the hospital encounter of 11/06/18 (from the past 48 hour(s))  Basic metabolic panel     Status: Abnormal   Collection Time: 11/06/18  6:21 PM  Result Value Ref Range   Sodium 133 (L) 135 - 145 mmol/L   Potassium 3.5 3.5 - 5.1 mmol/L   Chloride 99 98 - 111 mmol/L   CO2 20 (L) 22 - 32 mmol/L   Glucose, Bld 148 (H) 70 - 99 mg/dL   BUN 53 (H) 8 - 23 mg/dL   Creatinine, Ser 4.01 (H) 0.61 - 1.24 mg/dL   Calcium 7.6 (L) 8.9 - 10.3 mg/dL   GFR calc non Af Amer 14 (L) >60 mL/min   GFR calc Af Amer 16 (L) >60 mL/min   Anion gap 14 5 - 15    Comment: Performed at [facility], [place] [address] [place], [place] [address]  CBC WITH DIFFERENTIAL     Status: Abnormal   Collection Time: 11/06/18  6:21 PM  Result Value Ref Range   WBC 9.0 4.0 - 10.5 K/uL   RBC 3.52 (L) 4.22 - 5.81 MIL/uL   Hemoglobin 11.3 (L) 13.0 - 17.0 g/dL   HCT 35.8 (L) 39.0 - 52.0 %   MCV 101.7 (H) 80.0 - 100.0 fL   MCH 32.1 26.0 - 34.0 pg   MCHC 31.6 30.0 - 36.0 g/dL   RDW 14.4 11.5 - 15.5 %   Platelets 57 (L) 150 - 400 K/uL    Comment: REPEATED TO VERIFY PLATELET COUNT CONFIRMED BY SMEAR Immature Platelet Fraction may be clinically indicated, consider ordering this additional test [id number]    nRBC 0.0 0.0 - 0.2 %   Neutrophils Relative % 78 %   Neutro Abs 7.0 1.7 - 7.7 K/uL   Lymphocytes Relative 5 %   Lymphs Abs 0.5 (L) 0.7 - 4.0 K/uL   Monocytes Relative 9 %   Monocytes Absolute 0.8 0.1 - 1.0 K/uL   Eosinophils Relative 0 %   Eosinophils Absolute 0.0 0.0 - 0.5 K/uL   Basophils Relative 0 %   Basophils Absolute 0.0 0.0 - 0.1 K/uL   WBC Morphology INCREASED BANDS (>20% BANDS)     Comment: VACUOLATED NEUTROPHILS   Immature  Granulocytes 8 %   Abs Immature Granulocytes 0.70 (H) 0.00 - 0.07 K/uL   Burr Cells PRESENT     Comment: Performed at [facility], [place] [address] [place], [place] [address]  Troponin I (High Sensitivity)     Status: Abnormal   Collection Time: 11/06/18  6:21 PM  Result Value Ref Range   Troponin I (High Sensitivity) 29 (H) <18 ng/L    Comment: (NOTE) Elevated high sensitivity troponin I (hsTnI) values and significant  changes across serial measurements may suggest ACS but many other  chronic and acute conditions are known to elevate hsTnI results.  Refer to the "Links" section for chest pain algorithms and additional  guidance. Performed at [facility], [place] [address], [place],  [address]   Hepatic function panel     Status: Abnormal   Collection Time: 11/06/18  6:21 PM  Result Value Ref Range   Total Protein 5.8 (L) 6.5 - 8.1 g/dL   Albumin 2.7 (L) 3.5 - 5.0 g/dL   AST 23 15 - 41 U/L   ALT 19 0 - 44 U/L   Alkaline Phosphatase 92 38 - 126 U/L   Total Bilirubin 1.1 0.3 - 1.2 mg/dL   Bilirubin, Direct 0.7 (H) 0.0 - 0.2 mg/dL   Indirect Bilirubin 0.4 0.3 - 0.9 mg/dL  Comment: Performed at [facility], [place] [address], [place], [place] [address]  Lipase, blood     Status: None   Collection Time: 11/06/18  6:21 PM  Result Value Ref Range   Lipase 31 11 - 51 U/L    Comment: Performed at [facility], [place] [address], [place], [place] [address]  Urinalysis, Routine w reflex microscopic     Status: Abnormal   Collection Time: 11/06/18  6:22 PM  Result Value Ref Range   Color, Urine AMBER (A) YELLOW    Comment: BIOCHEMICALS MAY BE AFFECTED BY COLOR   APPearance CLOUDY (A) CLEAR   Specific Gravity, Urine 1.027 1.005 - 1.030   pH 5.0 5.0 - 8.0   Glucose, UA NEGATIVE NEGATIVE mg/dL   Hgb urine dipstick NEGATIVE NEGATIVE   Bilirubin Urine NEGATIVE NEGATIVE   Ketones, ur NEGATIVE NEGATIVE mg/dL    Protein, ur 100 (A) NEGATIVE mg/dL   Nitrite NEGATIVE NEGATIVE   Leukocytes,Ua TRACE (A) NEGATIVE   RBC / HPF 6-10 0 - 5 RBC/hpf   WBC, UA 11-20 0 - 5 WBC/hpf   Bacteria, UA RARE (A) NONE SEEN   Amorphous Crystal PRESENT     Comment: Performed at [facility], [place] [address], [place], [place] [address]  Protime-INR     Status: Abnormal   Collection Time: 11/06/18  6:33 PM  Result Value Ref Range   Prothrombin Time 29.2 (H) 11.4 - 15.2 seconds   INR 2.8 (H) 0.8 - 1.2    Comment: (NOTE) INR goal varies based on device and disease states. Performed at [facility], [place] [address] [place], [place] [address]   CBG monitoring, ED     Status: Abnormal   Collection Time: 11/06/18  6:47 PM  Result Value Ref Range   Glucose-Capillary 134 (H) 70 - 99 mg/dL  SARS Coronavirus 2 (CEPHEID - Performed in [facility] hospital lab), Hosp Order     Status: None   Collection Time: 11/06/18  6:49 PM   Specimen: Nasopharyngeal Swab  Result Value Ref Range   SARS Coronavirus 2 NEGATIVE NEGATIVE    Comment: (NOTE) If result is NEGATIVE SARS-CoV-2 target nucleic acids are NOT DETECTED. The SARS-CoV-2 RNA is generally detectable in upper and lower  respiratory specimens during the acute phase of infection. The lowest  concentration of SARS-CoV-2 viral copies this assay can detect is 250  copies / mL. A negative result does not preclude SARS-CoV-2 infection  and should not be used as the sole basis for treatment or other  patient management decisions.  A negative result may occur with  improper specimen collection / handling, submission of specimen other  than nasopharyngeal swab, presence of viral mutation(s) within the  areas targeted by this assay, and inadequate number of viral copies  (<250 copies / mL). A negative result must be combined with clinical  observations, patient history, and epidemiological information. If result is POSITIVE SARS-CoV-2 target  nucleic acids are DETECTED. The SARS-CoV-2 RNA is generally detectable in upper and lower  respiratory specimens dur ing the acute phase of infection.  Positive  results are indicative of active infection with SARS-CoV-2.  Clinical  correlation with patient history and other diagnostic information is  necessary to determine patient infection status.  Positive results do  not rule out bacterial infection or co-infection with other viruses. If result is PRESUMPTIVE POSTIVE SARS-CoV-2 nucleic acids MAY BE PRESENT.   A presumptive positive result was obtained on  the submitted specimen  and confirmed on repeat testing.  While 2019 novel coronavirus  (SARS-CoV-2) nucleic acids may be present in the submitted sample  additional confirmatory testing may be necessary for epidemiological  and / or clinical management purposes  to differentiate between  SARS-CoV-2 and other Sarbecovirus currently known to infect humans.  If clinically indicated additional testing with an alternate test  methodology [phone]) is advised. The SARS-CoV-2 RNA is generally  detectable in upper and lower respiratory sp ecimens during the acute  phase of infection. The expected result is Negative. Fact Sheet for Patients:  StrictlyIdeas.no Fact Sheet for Healthcare Providers: BankingDealers.co.za This test is not yet approved or cleared by the [place] FDA and has been authorized for detection and/or diagnosis of SARS-CoV-2 by FDA under an Emergency Use Authorization (EUA).  This EUA will remain in effect (meaning this test can be used) for the duration of the COVID-19 declaration under Section 564(b)(1) of the Act, 21 U.S.C. section 360bbb-3(b)(1), unless the authorization is terminated or revoked sooner. Performed at [facility], [place] [address], [place], [place] [address]   POC occult blood, ED Provider will collect     Status: None    Collection Time: 11/06/18  7:59 PM  Result Value Ref Range   Fecal Occult Bld NEGATIVE NEGATIVE  Troponin I (High Sensitivity)     Status: Abnormal   Collection Time: 11/06/18  8:23 PM  Result Value Ref Range   Troponin I (High Sensitivity) 26.0 (H) <18 ng/L    Comment: (NOTE) Elevated high sensitivity troponin I (hsTnI) values and significant  changes across serial measurements may suggest ACS but many other  chronic and acute conditions are known to elevate hsTnI results.  Refer to the "Links" section for chest pain algorithms and additional  guidance. Performed at [facility], [place] [address], [place], [place] [address]   Lactic acid, plasma     Status: Abnormal   Collection Time: 11/06/18  9:32 PM  Result Value Ref Range   Lactic Acid, Venous 2.1 (HH) 0.5 - 1.9 mmol/L    Comment: CRITICAL RESULT CALLED TO, READ BACK BY AND VERIFIED WITH: HODGES AT 2302 ON 11/06/2018 BY JPM Performed at [place] [address], [place], [place] [address]     Chemistries  Recent Labs  Lab 11/06/18 1821  NA 133*  K 3.5  CL 99  CO2 20*  GLUCOSE 148*  BUN 53*  CREATININE 4.01*  CALCIUM 7.6*  AST 23  ALT 19  ALKPHOS 92  BILITOT 1.1   ------------------------------------------------------------------------------------------------------------------  ------------------------------------------------------------------------------------------------------------------ GFR: Estimated Creatinine Clearance: 18.8 mL/min (A) (by C-G formula based on SCr of 4.01 mg/dL (H)). Liver Function Tests: Recent Labs  Lab 11/06/18 1821  AST 23  ALT 19  ALKPHOS 92  BILITOT 1.1  PROT 5.8*  ALBUMIN 2.7*   Recent Labs  Lab 11/06/18 1821  LIPASE 31   No results for input(s): AMMONIA in the last 168 hours. Coagulation Profile: Recent Labs  Lab 11/06/18 1833  INR 2.8*   Cardiac Enzymes: No results for input(s): CKTOTAL, CKMB, CKMBINDEX, TROPONINI in the  last 168 hours. BNP (last 3 results) No results for input(s): PROBNP in the last 8760 hours. HbA1C: No results for input(s): HGBA1C in the last 72 hours. CBG: Recent Labs  Lab 11/06/18 1847  GLUCAP 134*   Lipid Profile: No results for input(s): CHOL, HDL, LDLCALC, TRIG, CHOLHDL, LDLDIRECT in the last 72 hours. Thyroid Function Tests: No results  for input(s): TSH, T4TOTAL, FREET4, T3FREE, THYROIDAB in the last 72 hours. Anemia Panel: No results for input(s): VITAMINB12, FOLATE, FERRITIN, TIBC, IRON, RETICCTPCT in the last 72 hours.  --------------------------------------------------------------------------------------------------------------- Urine analysis:    Component Value Date/Time   COLORURINE AMBER (A) 11/06/2018 1822   APPEARANCEUR CLOUDY (A) 11/06/2018 1822   LABSPEC 1.027 11/06/2018 1822   PHURINE 5.0 11/06/2018 1822   GLUCOSEU NEGATIVE 11/06/2018 1822   HGBUR NEGATIVE 11/06/2018 1822   BILIRUBINUR NEGATIVE 11/06/2018 1822   KETONESUR NEGATIVE 11/06/2018 1822   PROTEINUR 100 (A) 11/06/2018 1822   NITRITE NEGATIVE 11/06/2018 1822   LEUKOCYTESUR TRACE (A) 11/06/2018 1822      Imaging Results:    Ct Abdomen Pelvis Wo Contrast  Result Date: 11/06/2018 CLINICAL DATA:  73 year old male with chest and back pain and weakness and shortness of breath. EXAM: CT CHEST, ABDOMEN AND PELVIS WITHOUT CONTRAST TECHNIQUE: Multidetector CT imaging of the chest, abdomen and pelvis was performed following the standard protocol without IV contrast. COMPARISON:  Radiograph dated 01/27/2005 and CT of the abdomen pelvis dated 01/26/2005 FINDINGS: Evaluation of this exam is limited in the absence of intravenous contrast. CT CHEST FINDINGS Cardiovascular: There is mild cardiomegaly. No significant pericardial effusion. Coronary vascular calcification primarily involving the LAD and left circumflex artery. There is atherosclerotic calcification of the aortic valve leaflets as well as moderate  atherosclerotic calcification of the thoracic aorta. The central pulmonary arteries are grossly unremarkable. Mediastinum/Nodes: There is no hilar or mediastinal adenopathy. Several top-normal mediastinal lymph nodes measure up to 11 mm in short axis. The esophagus is grossly unremarkable. No mediastinal fluid collection. Lungs/Pleura: The lungs are clear. There is no pleural effusion or pneumothorax. The central airways are patent. There is apparent narrowing of the right middle lobe bronchus in the right hilum of indeterminate etiology. Although this may be related to chronic inflammation. An endobronchial or a small hilar lesion is not entirely excluded. This can be better evaluated with bronchoscopy on a nonemergent basis. Musculoskeletal: There is degenerative changes of the spine. No acute osseous pathology. CT ABDOMEN PELVIS FINDINGS No intra-abdominal free air or free fluid. Hepatobiliary: There is diffuse fatty infiltration of the liver. No intrahepatic biliary ductal dilatation. The gallbladder is partially contracted. Probable faint noncalcified stone in the gallbladder fundus. There is inflammatory changes surrounding the gallbladder. Findings concerning for acute cholecystitis. Further evaluation with ultrasound is recommended. Pancreas: Unremarkable. No pancreatic ductal dilatation or surrounding inflammatory changes. Spleen: Normal in size without focal abnormality. Adrenals/Urinary Tract: The adrenal glands are unremarkable. There is no hydronephrosis or nephrolithiasis on either side. Multiple bilateral renal hypodense lesions measure up to 5 cm in the interpolar aspect of the right kidney. These lesions are suboptimally evaluated on this noncontrast CT but demonstrate fluid attenuation most consistent with cysts. Ultrasound may provide better characterization on a nonemergent basis. The visualized ureters appear unremarkable. The urinary bladder is collapsed. There is apparent diffuse thickening of  the bladder wall which may be partly related to underdistention. Cystitis is not excluded. Correlation with urinalysis recommended. Stomach/Bowel: There is sigmoid diverticulosis with muscular hypertrophy. No active inflammatory changes. There are scattered colonic diverticula without active inflammatory changes. There is no bowel obstruction. The appendix is normal. Vascular/Lymphatic: Advanced aortoiliac atherosclerotic disease. No portal venous gas. There is no adenopathy. Reproductive: Mildly enlarged prostate gland measuring 4.7 cm in transverse axial diameter. The seminal vesicles are symmetric. Other: None Musculoskeletal: Degenerative changes of the spine. No acute osseous pathology. IMPRESSION: 1. No acute intrathoracic pathology. 2.  Inflammatory changes surrounding the gallbladder concerning for acute cholecystitis. Further evaluation with ultrasound is recommended. Noncalcified stones may be present within the gallbladder. 3. Apparent mild narrowing of the right middle lobe bronchus in the right hilum. This can be further evaluated with bronchoscopy on a nonemergent basis. 4. No hydronephrosis or nephrolithiasis. 5. Fatty liver. 6. Colonic diverticulosis. No bowel obstruction or active inflammation. Normal appendix. 7. Aortic Atherosclerosis (ICD10-I70.0). Electronically Signed   By: Anner Crete M.D.   On: 11/06/2018 20:32   Ct Chest Wo Contrast  Result Date: 11/06/2018 CLINICAL DATA:  73 year old male with chest and back pain and weakness and shortness of breath. EXAM: CT CHEST, ABDOMEN AND PELVIS WITHOUT CONTRAST TECHNIQUE: Multidetector CT imaging of the chest, abdomen and pelvis was performed following the standard protocol without IV contrast. COMPARISON:  Radiograph dated 01/27/2005 and CT of the abdomen pelvis dated 01/26/2005 FINDINGS: Evaluation of this exam is limited in the absence of intravenous contrast. CT CHEST FINDINGS Cardiovascular: There is mild cardiomegaly. No significant  pericardial effusion. Coronary vascular calcification primarily involving the LAD and left circumflex artery. There is atherosclerotic calcification of the aortic valve leaflets as well as moderate atherosclerotic calcification of the thoracic aorta. The central pulmonary arteries are grossly unremarkable. Mediastinum/Nodes: There is no hilar or mediastinal adenopathy. Several top-normal mediastinal lymph nodes measure up to 11 mm in short axis. The esophagus is grossly unremarkable. No mediastinal fluid collection. Lungs/Pleura: The lungs are clear. There is no pleural effusion or pneumothorax. The central airways are patent. There is apparent narrowing of the right middle lobe bronchus in the right hilum of indeterminate etiology. Although this may be related to chronic inflammation. An endobronchial or a small hilar lesion is not entirely excluded. This can be better evaluated with bronchoscopy on a nonemergent basis. Musculoskeletal: There is degenerative changes of the spine. No acute osseous pathology. CT ABDOMEN PELVIS FINDINGS No intra-abdominal free air or free fluid. Hepatobiliary: There is diffuse fatty infiltration of the liver. No intrahepatic biliary ductal dilatation. The gallbladder is partially contracted. Probable faint noncalcified stone in the gallbladder fundus. There is inflammatory changes surrounding the gallbladder. Findings concerning for acute cholecystitis. Further evaluation with ultrasound is recommended. Pancreas: Unremarkable. No pancreatic ductal dilatation or surrounding inflammatory changes. Spleen: Normal in size without focal abnormality. Adrenals/Urinary Tract: The adrenal glands are unremarkable. There is no hydronephrosis or nephrolithiasis on either side. Multiple bilateral renal hypodense lesions measure up to 5 cm in the interpolar aspect of the right kidney. These lesions are suboptimally evaluated on this noncontrast CT but demonstrate fluid attenuation most consistent  with cysts. Ultrasound may provide better characterization on a nonemergent basis. The visualized ureters appear unremarkable. The urinary bladder is collapsed. There is apparent diffuse thickening of the bladder wall which may be partly related to underdistention. Cystitis is not excluded. Correlation with urinalysis recommended. Stomach/Bowel: There is sigmoid diverticulosis with muscular hypertrophy. No active inflammatory changes. There are scattered colonic diverticula without active inflammatory changes. There is no bowel obstruction. The appendix is normal. Vascular/Lymphatic: Advanced aortoiliac atherosclerotic disease. No portal venous gas. There is no adenopathy. Reproductive: Mildly enlarged prostate gland measuring 4.7 cm in transverse axial diameter. The seminal vesicles are symmetric. Other: None Musculoskeletal: Degenerative changes of the spine. No acute osseous pathology. IMPRESSION: 1. No acute intrathoracic pathology. 2. Inflammatory changes surrounding the gallbladder concerning for acute cholecystitis. Further evaluation with ultrasound is recommended. Noncalcified stones may be present within the gallbladder. 3. Apparent mild narrowing of the right middle lobe bronchus  in the right hilum. This can be further evaluated with bronchoscopy on a nonemergent basis. 4. No hydronephrosis or nephrolithiasis. 5. Fatty liver. 6. Colonic diverticulosis. No bowel obstruction or active inflammation. Normal appendix. 7. Aortic Atherosclerosis (ICD10-I70.0). Electronically Signed   By: Anner Crete M.D.   On: 11/06/2018 20:32   Dg Chest Port 1 View  Result Date: 11/06/2018 CLINICAL DATA:  Dizziness, weakness, shortness of breath. EXAM: PORTABLE CHEST 1 VIEW COMPARISON:  None. FINDINGS: Normal heart size with normal mediastinal contours. No focal airspace disease, pleural effusion or pneumothorax. No pulmonary edema. No acute osseous abnormalities. IMPRESSION: No acute chest finding. Electronically  Signed   By: Keith Rake M.D.   On: 11/06/2018 19:56   US Abdomen Limited Ruq  Result Date: 11/06/2018 CLINICAL DATA:  Right upper quadrant pain EXAM: ULTRASOUND ABDOMEN LIMITED RIGHT UPPER QUADRANT COMPARISON:  None. FINDINGS: Gallbladder: There is diffuse gallbladder wall thickening with the gallbladder measuring approximately 1 cm in thickness. There are no gallstones. The sonographic Percell Miller sign is negative. There is mild pericholecystic free fluid. Common bile duct: Diameter: 0.3 cm Liver: Diffuse increased echogenicity with slightly heterogeneous liver. Appearance typically secondary to fatty infiltration. Fibrosis secondary consideration. No secondary findings of cirrhosis noted. No focal hepatic lesion or intrahepatic biliary duct dilatation. Portal vein is patent on color Doppler imaging with normal direction of blood flow towards the liver. IMPRESSION: 1. Nonspecific gallbladder wall thickening in the absence of gallstones or a positive sonographic Murphy sign. 2. Hepatic steatosis. Electronically Signed   By: Constance Holster M.D.   On: 11/06/2018 21:44   Pafib at 90, nl axis, nl int, no st-t changes suggestive of ischemia,    Assessment & Plan:    Principal Problem:   ARF (acute renal failure) (HCC) Active Problems:   Hyperlipidemia   Hypertension   PAF (paroxysmal atrial fibrillation) (HCC)   Hypotension   Acute lower UTI   Anemia   Thrombocytopenia (HCC)  ARF Check urine sodium, urine creatinine, urine prot, urine eosinophils Check spep, immunofixation Check CPK STOP Lisinopril STOP Hydrochlorothiazide STOP OTC Aleve Hydrate with ns iv Check cmp in am  Hypotension likely secondary to sepsis Check trop , cortisol Check cardiac echo in am  Sepsis likely secondary to uti Blood culture x2 Check urine culture F/u lactic acid Zosyn iv pharmacy to dose No Vanco for now due to impaired renal function  Pafib Hold carvedilol due to hypotension, can restart if bp  better with holding parameters Hold Eliquis temporarily til see what plt count is in the AM as well as due to ARF  Anemia/ Thrombocytopenia (no mention of shistocytes) Check LDH, if high please consider oncology consultation, r/o TTP Check cbc in am  Gout Hold Allopurinol temporarily due to ARF Hold Colchicine  Hyperlipidemia Cont Zetia   Narrowing of the right middle lobe bronchus in the right hilum This can be further evaluated with bronchoscopy on a nonemergent basis.  ETOH use Please monitor for signs of withdrawal   DVT Prophylaxis-   SCDs , please resume Eliquis in AM, pharmacy to dose as long as platelet count not worsening  AM Labs Ordered, also please review Full Orders  Family Communication: Admission, patients condition and plan of care including tests being ordered have been discussed with the patient  who indicate understanding and agree with the plan and Code Status.  Code Status:  FULL CODE, pt states that he has already notified his wife that he is being admitted   Admission status:  Inpatient: Based on patients clinical presentation and evaluation of above clinical data, I have made determination that patient meets Inpatient criteria at this time. Pt will require iv abx, and iv hydration for sepsis and ARF,  High risk of clinical deterioration, will require inpatient > 2nites status   Time spent in minutes : 70    Jani Gravel M.D on 11/06/2018 at 11:31 PM

## 2018-11-06 NOTE — ED Notes (Signed)
$  [phone] cash in pts possession.  Cash counted with 2nd RN, Kaleen Mask, in pts presence.  Per pt request, cash will be locked up in security lock box in ED.

## 2018-11-06 NOTE — ED Notes (Signed)
Pt cash has been locked up with security. Key and paperwork with patient's belongings in his room. He verbalizes understanding.

## 2018-11-06 NOTE — ED Notes (Signed)
Counted pts personal items with 2nd nurse, notified security and charge to have values locked up

## 2018-11-06 NOTE — ED Triage Notes (Signed)
Pt BIBA from home.   Per EMS-  C/o dizziness, weakness, SHOB x 3 days.  Night sweats x 3days. Denies cough.  Denies fever, CP.    Initial BP was 60/40- pt received 1300 mL NaCl BP- then 93/64.  Pt AOx4, ambulatory on scene.    18 g L AC

## 2018-11-06 NOTE — ED Notes (Signed)
Report attempted was told to call back later

## 2018-11-06 NOTE — ED Notes (Signed)
Pt returned from CT °

## 2018-11-06 NOTE — ED Notes (Signed)
Pt transported to CT ?

## 2018-11-06 NOTE — ED Notes (Signed)
In and out done 40 cc out

## 2018-11-06 NOTE — ED Provider Notes (Signed)
[facility] Provider Note   CSN: [id number] Arrival date & time: 11/06/18  1753    History   Chief Complaint No chief complaint on file.   HPI Jeffrey Hicks is a 73 y.o. male.     Pt presents to the ED today not feeling well.  The pt said he has been feeling dizzy and weak for the past 3 days.  He felt like he was going to pass out today, so he called EMS.  When EMS arrived, his BP was 60/40.  They gave him 1300 cc of NS and his bp came up to the 90s.  The pt denies any f/c.  He denies any known covid exposures.  The pt denies any recent medication changes.  He does have a.fib and is on Eliquis.  CHA2DS2/VAS Stroke Risk Points  Current as of 13 minutes ago     2 >= 2 Points: High Risk  1 - 1.99 Points: Medium Risk  0 Points: Low Risk    This is the only CHA2DS2/VAS Stroke Risk Points available for the past  year.: Last Change: N/A     Details    This score determines the patient's risk of having a stroke if the  patient has atrial fibrillation.       Points Metrics  0 Has Congestive Heart Failure:  No    Current as of 13 minutes ago  0 Has Vascular Disease:  No    Current as of 13 minutes ago  1 Has Hypertension:  Yes    Current as of 13 minutes ago  1 Age:  70    Current as of 13 minutes ago  0 Has Diabetes:  No    Current as of 13 minutes ago  0 Had Stroke:  No  Had TIA:  No  Had thromboembolism:  No    Current as of 13 minutes ago  0 Male:  No    Current as of 13 minutes ago              Past Medical History:  Diagnosis Date  . Hyperlipidemia   . Hypertension   . Left ventricular outflow tract obstruction     Patient Active Problem List   Diagnosis Date Noted  . ARF (acute renal failure) (HCC) 11/06/2018  . Hypotension 11/06/2018  . PAF (paroxysmal atrial fibrillation) (HCC) 05/13/2015  . Hyperlipidemia   . Left ventricular outflow tract obstruction   . Hypertension     Past Surgical History:   Procedure Laterality Date  . OTHER SURGICAL HISTORY     no surgical HX        Home Medications    Prior to Admission medications   Medication Sig Start Date End Date Taking? Authorizing Provider  acetaminophen (TYLENOL) 650 MG CR tablet Take 650 mg by mouth every 8 (eight) hours as needed for pain.   Yes [provider]  allopurinol (ZYLOPRIM) 100 MG tablet Take 100 mg by mouth daily.  09/21/18  Yes [provider]  carvedilol (COREG) 12.5 MG tablet Take 1 tablet (12.5 mg total) by mouth 2 (two) times daily with a meal. 12/29/17  Yes [name], [name], MD  colchicine 0.6 MG tablet Take 2 tablets by mouth See admin instructions. Take 2 tablets by mouth at first sign of gout and may repeat dose in one hour 09/21/18  Yes [provider]  ELIQUIS 5 MG TABS tablet TAKE ONE TABLET BY MOUTH TWICE DAILY Patient taking  differently: Take 5 mg by mouth 2 (two) times daily.  05/09/18  Yes [name], Deloris PingPhilip J, MD  ezetimibe (ZETIA) 10 MG tablet Take 10 mg by mouth daily. 09/21/18  Yes [provider]  hydrochlorothiazide (HYDRODIURIL) 12.5 MG tablet Take 1 tablet (12.5 mg total) by mouth daily. 12/29/17 01/19/19 Yes [name], Deloris PingPhilip J, MD  lisinopril (PRINIVIL,ZESTRIL) 10 MG tablet Take 1 tablet (10 mg total) by mouth daily. 12/29/17  Yes [name], Deloris PingPhilip J, MD  Multiple Vitamin (MULTIVITAMIN) tablet Take 1 tablet by mouth daily.     Yes [provider]  Omega-3 Fatty Acids (FISH OIL PO) Take 1,000 mg by mouth daily.    Yes [provider]    Family History Family History  Problem Relation Age of Onset  . Stroke Mother   . Transient ischemic attack Father   . Diabetes Brother     Social History Social History   Tobacco Use  . Smoking status: Former Smoker    Quit date: 08/25/2000    Years since quitting: 18.2  . Smokeless tobacco: Never Used  Substance Use Topics  . Alcohol use: No  . Drug use: No     Allergies   Crestor  [rosuvastatin calcium]  and Lipitor  [atorvastatin]   Review of Systems Review of Systems  Neurological: Positive for weakness.  All other systems reviewed and are negative.    Physical Exam Updated Vital Signs BP (!) 82/55   Pulse 85   Temp 98.1 F (36.7 C) (Oral)   Resp (!) 21   Ht 5\' 10"  (1.778 m)   Wt 93 kg   SpO2 100%   BMI 29.41 kg/m   Physical Exam Vitals signs and nursing note reviewed.  Constitutional:      Appearance: Normal appearance.  HENT:     Head: Normocephalic and atraumatic.     Right Ear: External ear normal.     Left Ear: External ear normal.     Nose: Nose normal.     Mouth/Throat:     Mouth: Mucous membranes are moist.     Pharynx: Oropharynx is clear.  Eyes:     Extraocular Movements: Extraocular movements intact.     Conjunctiva/sclera: Conjunctivae normal.     Pupils: Pupils are equal, round, and reactive to light.  Neck:     Musculoskeletal: Normal range of motion and neck supple.  Cardiovascular:     Rate and Rhythm: Normal rate. Rhythm irregular.     Pulses: Normal pulses.     Heart sounds: Normal heart sounds.  Pulmonary:     Effort: Pulmonary effort is normal.     Breath sounds: Normal breath sounds.  Abdominal:     General: Abdomen is flat. Bowel sounds are normal.     Palpations: Abdomen is soft.  Musculoskeletal: Normal range of motion.  Skin:    General: Skin is warm.     Capillary Refill: Capillary refill takes less than 2 seconds.  Neurological:     General: No focal deficit present.     Mental Status: He is alert and oriented to person, place, and time.  Psychiatric:        Mood and Affect: Mood normal.        Behavior: Behavior normal.        Thought Content: Thought content normal.        Judgment: Judgment normal.      ED Treatments / Results  Labs (all labs ordered are listed, but only abnormal results are  displayed) Labs Reviewed  BASIC METABOLIC PANEL - Abnormal; Notable for the following components:      Result Value    Sodium 133 (*)    CO2 20 (*)    Glucose, Bld 148 (*)    BUN 53 (*)    Creatinine, Ser 4.01 (*)    Calcium 7.6 (*)    GFR calc non Af Amer 14 (*)    GFR calc Af Amer 16 (*)    All other components within normal limits  CBC WITH DIFFERENTIAL/PLATELET - Abnormal; Notable for the following components:   RBC 3.52 (*)    Hemoglobin 11.3 (*)    HCT 35.8 (*)    MCV 101.7 (*)    Platelets 57 (*)    Lymphs Abs 0.5 (*)    Abs Immature Granulocytes 0.70 (*)    All other components within normal limits  URINALYSIS, ROUTINE W REFLEX MICROSCOPIC - Abnormal; Notable for the following components:   Color, Urine AMBER (*)    APPearance CLOUDY (*)    Protein, ur 100 (*)    Leukocytes,Ua TRACE (*)    Bacteria, UA RARE (*)    All other components within normal limits  PROTIME-INR - Abnormal; Notable for the following components:   Prothrombin Time 29.2 (*)    INR 2.8 (*)    All other components within normal limits  HEPATIC FUNCTION PANEL - Abnormal; Notable for the following components:   Total Protein 5.8 (*)    Albumin 2.7 (*)    Bilirubin, Direct 0.7 (*)    All other components within normal limits  LACTIC ACID, PLASMA - Abnormal; Notable for the following components:   Lactic Acid, Venous 2.1 (*)    All other components within normal limits  CBG MONITORING, ED - Abnormal; Notable for the following components:   Glucose-Capillary 134 (*)    All other components within normal limits  TROPONIN I (HIGH SENSITIVITY) - Abnormal; Notable for the following components:   Troponin I (High Sensitivity) 29 (*)    All other components within normal limits  TROPONIN I (HIGH SENSITIVITY) - Abnormal; Notable for the following components:   Troponin I (High Sensitivity) 26.0 (*)    All other components within normal limits  SARS CORONAVIRUS 2 (HOSPITAL ORDER, PERFORMED IN  HOSPITAL LAB)  CULTURE, BLOOD (ROUTINE X 2)  CULTURE, BLOOD (ROUTINE X 2)  LIPASE, BLOOD  LACTIC ACID, PLASMA  POC  OCCULT BLOOD, ED    EKG EKG Interpretation  Date/Time:  Sunday November 06 2018 18:08:14 EDT Ventricular Rate:  93 PR Interval:    QRS Duration: 103 QT Interval:  384 QTC Calculation: 478 R Axis:   77 Text Interpretation:  Atrial fibrillation Ventricular premature complex Borderline prolonged QT interval hx afib Confirmed by Jacalyn LefevreHaviland, Hadlei Stitt [phone]([id number]) on 11/06/2018 6:50:30 PM   Radiology Ct Abdomen Pelvis Wo Contrast  Result Date: 11/06/2018 CLINICAL DATA:  73 year old male with chest and back pain and weakness and shortness of breath. EXAM: CT CHEST, ABDOMEN AND PELVIS WITHOUT CONTRAST TECHNIQUE: Multidetector CT imaging of the chest, abdomen and pelvis was performed following the standard protocol without IV contrast. COMPARISON:  Radiograph dated 01/27/2005 and CT of the abdomen pelvis dated 01/26/2005 FINDINGS: Evaluation of this exam is limited in the absence of intravenous contrast. CT CHEST FINDINGS Cardiovascular: There is mild cardiomegaly. No significant pericardial effusion. Coronary vascular calcification primarily involving the LAD and left circumflex artery. There is atherosclerotic calcification of the aortic valve leaflets as well as moderate  atherosclerotic calcification of the thoracic aorta. The central pulmonary arteries are grossly unremarkable. Mediastinum/Nodes: There is no hilar or mediastinal adenopathy. Several top-normal mediastinal lymph nodes measure up to 11 mm in short axis. The esophagus is grossly unremarkable. No mediastinal fluid collection. Lungs/Pleura: The lungs are clear. There is no pleural effusion or pneumothorax. The central airways are patent. There is apparent narrowing of the right middle lobe bronchus in the right hilum of indeterminate etiology. Although this may be related to chronic inflammation. An endobronchial or a small hilar lesion is not entirely excluded. This can be better evaluated with bronchoscopy on a nonemergent basis. Musculoskeletal: There  is degenerative changes of the spine. No acute osseous pathology. CT ABDOMEN PELVIS FINDINGS No intra-abdominal free air or free fluid. Hepatobiliary: There is diffuse fatty infiltration of the liver. No intrahepatic biliary ductal dilatation. The gallbladder is partially contracted. Probable faint noncalcified stone in the gallbladder fundus. There is inflammatory changes surrounding the gallbladder. Findings concerning for acute cholecystitis. Further evaluation with ultrasound is recommended. Pancreas: Unremarkable. No pancreatic ductal dilatation or surrounding inflammatory changes. Spleen: Normal in size without focal abnormality. Adrenals/Urinary Tract: The adrenal glands are unremarkable. There is no hydronephrosis or nephrolithiasis on either side. Multiple bilateral renal hypodense lesions measure up to 5 cm in the interpolar aspect of the right kidney. These lesions are suboptimally evaluated on this noncontrast CT but demonstrate fluid attenuation most consistent with cysts. Ultrasound may provide better characterization on a nonemergent basis. The visualized ureters appear unremarkable. The urinary bladder is collapsed. There is apparent diffuse thickening of the bladder wall which may be partly related to underdistention. Cystitis is not excluded. Correlation with urinalysis recommended. Stomach/Bowel: There is sigmoid diverticulosis with muscular hypertrophy. No active inflammatory changes. There are scattered colonic diverticula without active inflammatory changes. There is no bowel obstruction. The appendix is normal. Vascular/Lymphatic: Advanced aortoiliac atherosclerotic disease. No portal venous gas. There is no adenopathy. Reproductive: Mildly enlarged prostate gland measuring 4.7 cm in transverse axial diameter. The seminal vesicles are symmetric. Other: None Musculoskeletal: Degenerative changes of the spine. No acute osseous pathology. IMPRESSION: 1. No acute intrathoracic pathology. 2.  Inflammatory changes surrounding the gallbladder concerning for acute cholecystitis. Further evaluation with ultrasound is recommended. Noncalcified stones may be present within the gallbladder. 3. Apparent mild narrowing of the right middle lobe bronchus in the right hilum. This can be further evaluated with bronchoscopy on a nonemergent basis. 4. No hydronephrosis or nephrolithiasis. 5. Fatty liver. 6. Colonic diverticulosis. No bowel obstruction or active inflammation. Normal appendix. 7. Aortic Atherosclerosis (ICD10-I70.0). Electronically Signed   By: Elgie CollardArash  Radparvar M.D.   On: 11/06/2018 20:32   Ct Chest Wo Contrast  Result Date: 11/06/2018 CLINICAL DATA:  73 year old male with chest and back pain and weakness and shortness of breath. EXAM: CT CHEST, ABDOMEN AND PELVIS WITHOUT CONTRAST TECHNIQUE: Multidetector CT imaging of the chest, abdomen and pelvis was performed following the standard protocol without IV contrast. COMPARISON:  Radiograph dated 01/27/2005 and CT of the abdomen pelvis dated 01/26/2005 FINDINGS: Evaluation of this exam is limited in the absence of intravenous contrast. CT CHEST FINDINGS Cardiovascular: There is mild cardiomegaly. No significant pericardial effusion. Coronary vascular calcification primarily involving the LAD and left circumflex artery. There is atherosclerotic calcification of the aortic valve leaflets as well as moderate atherosclerotic calcification of the thoracic aorta. The central pulmonary arteries are grossly unremarkable. Mediastinum/Nodes: There is no hilar or mediastinal adenopathy. Several top-normal mediastinal lymph nodes measure up to 11 mm in short  axis. The esophagus is grossly unremarkable. No mediastinal fluid collection. Lungs/Pleura: The lungs are clear. There is no pleural effusion or pneumothorax. The central airways are patent. There is apparent narrowing of the right middle lobe bronchus in the right hilum of indeterminate etiology. Although this  may be related to chronic inflammation. An endobronchial or a small hilar lesion is not entirely excluded. This can be better evaluated with bronchoscopy on a nonemergent basis. Musculoskeletal: There is degenerative changes of the spine. No acute osseous pathology. CT ABDOMEN PELVIS FINDINGS No intra-abdominal free air or free fluid. Hepatobiliary: There is diffuse fatty infiltration of the liver. No intrahepatic biliary ductal dilatation. The gallbladder is partially contracted. Probable faint noncalcified stone in the gallbladder fundus. There is inflammatory changes surrounding the gallbladder. Findings concerning for acute cholecystitis. Further evaluation with ultrasound is recommended. Pancreas: Unremarkable. No pancreatic ductal dilatation or surrounding inflammatory changes. Spleen: Normal in size without focal abnormality. Adrenals/Urinary Tract: The adrenal glands are unremarkable. There is no hydronephrosis or nephrolithiasis on either side. Multiple bilateral renal hypodense lesions measure up to 5 cm in the interpolar aspect of the right kidney. These lesions are suboptimally evaluated on this noncontrast CT but demonstrate fluid attenuation most consistent with cysts. Ultrasound may provide better characterization on a nonemergent basis. The visualized ureters appear unremarkable. The urinary bladder is collapsed. There is apparent diffuse thickening of the bladder wall which may be partly related to underdistention. Cystitis is not excluded. Correlation with urinalysis recommended. Stomach/Bowel: There is sigmoid diverticulosis with muscular hypertrophy. No active inflammatory changes. There are scattered colonic diverticula without active inflammatory changes. There is no bowel obstruction. The appendix is normal. Vascular/Lymphatic: Advanced aortoiliac atherosclerotic disease. No portal venous gas. There is no adenopathy. Reproductive: Mildly enlarged prostate gland measuring 4.7 cm in transverse  axial diameter. The seminal vesicles are symmetric. Other: None Musculoskeletal: Degenerative changes of the spine. No acute osseous pathology. IMPRESSION: 1. No acute intrathoracic pathology. 2. Inflammatory changes surrounding the gallbladder concerning for acute cholecystitis. Further evaluation with ultrasound is recommended. Noncalcified stones may be present within the gallbladder. 3. Apparent mild narrowing of the right middle lobe bronchus in the right hilum. This can be further evaluated with bronchoscopy on a nonemergent basis. 4. No hydronephrosis or nephrolithiasis. 5. Fatty liver. 6. Colonic diverticulosis. No bowel obstruction or active inflammation. Normal appendix. 7. Aortic Atherosclerosis (ICD10-I70.0). Electronically Signed   By: Elgie Collard M.D.   On: 11/06/2018 20:32   Dg Chest Port 1 View  Result Date: 11/06/2018 CLINICAL DATA:  Dizziness, weakness, shortness of breath. EXAM: PORTABLE CHEST 1 VIEW COMPARISON:  None. FINDINGS: Normal heart size with normal mediastinal contours. No focal airspace disease, pleural effusion or pneumothorax. No pulmonary edema. No acute osseous abnormalities. IMPRESSION: No acute chest finding. Electronically Signed   By: Narda Rutherford M.D.   On: 11/06/2018 19:56   US Abdomen Limited Ruq  Result Date: 11/06/2018 CLINICAL DATA:  Right upper quadrant pain EXAM: ULTRASOUND ABDOMEN LIMITED RIGHT UPPER QUADRANT COMPARISON:  None. FINDINGS: Gallbladder: There is diffuse gallbladder wall thickening with the gallbladder measuring approximately 1 cm in thickness. There are no gallstones. The sonographic Eulah Pont sign is negative. There is mild pericholecystic free fluid. Common bile duct: Diameter: 0.3 cm Liver: Diffuse increased echogenicity with slightly heterogeneous liver. Appearance typically secondary to fatty infiltration. Fibrosis secondary consideration. No secondary findings of cirrhosis noted. No focal hepatic lesion or intrahepatic biliary duct  dilatation. Portal vein is patent on color Doppler imaging with normal direction of blood  flow towards the liver. IMPRESSION: 1. Nonspecific gallbladder wall thickening in the absence of gallstones or a positive sonographic Murphy sign. 2. Hepatic steatosis. Electronically Signed   By: Constance Holster M.D.   On: 11/06/2018 21:44    Procedures Procedures (including critical care time)  Medications Ordered in ED Medications  sodium chloride 0.9 % bolus 1,000 mL (0 mLs Intravenous Stopped 11/06/18 1950)    And  0.9 %  sodium chloride infusion ( Intravenous New Bag/Given 11/06/18 1842)  sodium chloride 0.9 % bolus 1,000 mL (0 mLs Intravenous Stopped 11/06/18 1950)  sodium chloride 0.9 % bolus 1,000 mL (0 mLs Intravenous Stopped 11/06/18 2113)  sodium chloride 0.9 % bolus 1,000 mL (0 mLs Intravenous Stopped 11/06/18 2319)  piperacillin-tazobactam (ZOSYN) IVPB 3.375 g (0 g Intravenous Stopped 11/06/18 2302)     Initial Impression / Assessment and Plan / ED Course  I have reviewed the triage vital signs and the nursing notes.  Pertinent labs & imaging results that were available during my care of the patient were reviewed by me and considered in my medical decision making (see chart for details).    CRITICAL CARE Performed by: Isla Pence   Total critical care time: 45 minutes  Critical care time was exclusive of separately billable procedures and treating other patients.  Critical care was necessary to treat or prevent imminent or life-threatening deterioration.  Critical care was time spent personally by me on the following activities: development of treatment plan with patient and/or surrogate as well as nursing, discussions with consultants, evaluation of patient's response to treatment, examination of patient, obtaining history from patient or surrogate, ordering and performing treatments and interventions, ordering and review of laboratory studies, ordering and review of radiographic  studies, pulse oximetry and re-evaluation of patient's condition.  Pt's SBP is finally in the 90s after several liters of fluid.  There was a questionable cholecystitis on his abd ct, so I did an [place] which did not agree.  Pt has no RUQ pain.  I did give him IV zosyn in case he had sepsis, but lactic is ok.  Blood cultures are pending.  Pt does have ARF, so I think he may just be dehydrated.  He is afebrile and WBC is nl.  Hgb is lower than in 2017, but stool is guaiac negative.  He is feeling better after fluids.  Covid negative.  Jeffrey Hicks was evaluated in Emergency Department on 11/06/2018 for the symptoms described in the history of present illness. He was evaluated in the context of the global COVID-19 pandemic, which necessitated consideration that the patient might be at risk for infection with the SARS-CoV-2 virus that causes COVID-19. Institutional protocols and algorithms that pertain to the evaluation of patients at risk for COVID-19 are in a state of rapid change based on information released by regulatory bodies including the CDC and federal and state organizations. These policies and algorithms were followed during the patient's care in the ED.  He was d/w Dr. Maudie Mercury (triad) who will admit.  Final Clinical Impressions(s) / ED Diagnoses   Final diagnoses:  Pain  Hypotension, unspecified hypotension type  Acute renal failure, unspecified acute renal failure type ([place])  Dehydration  Chronic atrial fibrillation    ED Discharge Orders    None       Isla Pence, MD 11/06/18 2328

## 2018-11-07 ENCOUNTER — Inpatient Hospital Stay (HOSPITAL_COMMUNITY): Payer: Medicare Other

## 2018-11-07 ENCOUNTER — Other Ambulatory Visit: Payer: Self-pay

## 2018-11-07 DIAGNOSIS — I959 Hypotension, unspecified: Secondary | ICD-10-CM

## 2018-11-07 DIAGNOSIS — I10 Essential (primary) hypertension: Secondary | ICD-10-CM

## 2018-11-07 DIAGNOSIS — I340 Nonrheumatic mitral (valve) insufficiency: Secondary | ICD-10-CM

## 2018-11-07 DIAGNOSIS — I361 Nonrheumatic tricuspid (valve) insufficiency: Secondary | ICD-10-CM

## 2018-11-07 DIAGNOSIS — E785 Hyperlipidemia, unspecified: Secondary | ICD-10-CM

## 2018-11-07 LAB — COMPREHENSIVE METABOLIC PANEL
ALT: 29 U/L (ref 0–44)
ALT: 28 U/L (ref 0–44)
AST: 37 U/L (ref 15–41)
AST: 40 U/L (ref 15–41)
Albumin: 2.7 g/dL — ABNORMAL LOW (ref 3.5–5.0)
Albumin: 2.4 g/dL — ABNORMAL LOW (ref 3.5–5.0)
Alkaline Phosphatase: 74 U/L (ref 38–126)
Alkaline Phosphatase: 79 U/L (ref 38–126)
Anion gap: 13 (ref 5–15)
Anion gap: 13 (ref 5–15)
BUN: 56 mg/dL — ABNORMAL HIGH (ref 8–23)
BUN: 61 mg/dL — ABNORMAL HIGH (ref 8–23)
CO2: 18 mmol/L — ABNORMAL LOW (ref 22–32)
CO2: 15 mmol/L — ABNORMAL LOW (ref 22–32)
Calcium: 7.3 mg/dL — ABNORMAL LOW (ref 8.9–10.3)
Calcium: 7.5 mg/dL — ABNORMAL LOW (ref 8.9–10.3)
Chloride: 104 mmol/L (ref 98–111)
Chloride: 106 mmol/L (ref 98–111)
Creatinine, Ser: 4.01 mg/dL — ABNORMAL HIGH (ref 0.61–1.24)
Creatinine, Ser: 3.71 mg/dL — ABNORMAL HIGH (ref 0.61–1.24)
GFR calc Af Amer: 16 mL/min — ABNORMAL LOW (ref >60)
GFR calc Af Amer: 18 mL/min — ABNORMAL LOW (ref >60)
GFR calc non Af Amer: 14 mL/min — ABNORMAL LOW (ref >60)
GFR calc non Af Amer: 15 mL/min — ABNORMAL LOW (ref >60)
Glucose, Bld: 121 mg/dL — ABNORMAL HIGH (ref 70–99)
Glucose, Bld: 118 mg/dL — ABNORMAL HIGH (ref 70–99)
Potassium: 4.4 mmol/L (ref 3.5–5.1)
Potassium: 4.5 mmol/L (ref 3.5–5.1)
Sodium: 135 mmol/L (ref 135–145)
Sodium: 134 mmol/L — ABNORMAL LOW (ref 135–145)
Total Bilirubin: 1.2 mg/dL (ref 0.3–1.2)
Total Bilirubin: 1.1 mg/dL (ref 0.3–1.2)
Total Protein: 5.7 g/dL — ABNORMAL LOW (ref 6.5–8.1)
Total Protein: 5.5 g/dL — ABNORMAL LOW (ref 6.5–8.1)

## 2018-11-07 LAB — CBC
HCT: 35.9 % — ABNORMAL LOW (ref 39.0–52.0)
Hemoglobin: 11.5 g/dL — ABNORMAL LOW (ref 13.0–17.0)
MCH: 32.6 pg (ref 26.0–34.0)
MCHC: 32 g/dL (ref 30.0–36.0)
MCV: 101.7 fL — ABNORMAL HIGH (ref 80.0–100.0)
Platelets: 67 10*3/uL — ABNORMAL LOW (ref 150–400)
RBC: 3.53 MIL/uL — ABNORMAL LOW (ref 4.22–5.81)
RDW: 14.6 % (ref 11.5–15.5)
WBC: 8.5 10*3/uL (ref 4.0–10.5)
nRBC: 0 % (ref 0.0–0.2)

## 2018-11-07 LAB — ECHOCARDIOGRAM COMPLETE
Height: 70 in
Weight: 3559.11 oz

## 2018-11-07 LAB — PROTEIN / CREATININE RATIO, URINE
Creatinine, Urine: 314.15 mg/dL
Protein Creatinine Ratio: 0.88 mg/mg{Cre} — ABNORMAL HIGH (ref 0.00–0.15)
Total Protein, Urine: 276 mg/dL

## 2018-11-07 LAB — SEDIMENTATION RATE: Sed Rate: 70 mm/hr — ABNORMAL HIGH (ref 0–16)

## 2018-11-07 LAB — MRSA PCR SCREENING: MRSA by PCR: NEGATIVE

## 2018-11-07 LAB — LACTIC ACID, PLASMA: Lactic Acid, Venous: 1.6 mmol/L (ref 0.5–1.9)

## 2018-11-07 LAB — CORTISOL: Cortisol, Plasma: 38.1 ug/dL

## 2018-11-07 LAB — LACTATE DEHYDROGENASE: LDH: 133 U/L (ref 98–192)

## 2018-11-07 LAB — CK TOTAL AND CKMB (NOT AT ARMC)
CK, MB: 1.9 ng/mL (ref 0.5–5.0)
Relative Index: INVALID (ref 0.0–2.5)
Total CK: 23 U/L — ABNORMAL LOW (ref 49–397)

## 2018-11-07 LAB — SODIUM, URINE, RANDOM: Sodium, Ur: 18 mmol/L

## 2018-11-07 MED ORDER — CARVEDILOL 12.5 MG PO TABS
12.5000 mg | ORAL_TABLET | Freq: Two times a day (BID) | ORAL | Status: DC
  Administered 2018-11-07 (×2): 12.5 mg via ORAL
  Filled 2018-11-07 (×2): qty 1

## 2018-11-07 MED ORDER — ORAL CARE MOUTH RINSE
15.0000 mL | Freq: Two times a day (BID) | OROMUCOSAL | Status: DC
  Administered 2018-11-07 – 2018-11-10 (×8): 15 mL via OROMUCOSAL

## 2018-11-07 MED ORDER — SODIUM CHLORIDE 0.9 % IV SOLN: INTRAVENOUS | Status: DC

## 2018-11-07 MED ORDER — CHLORHEXIDINE GLUCONATE CLOTH 2 % EX PADS
6.0000 | MEDICATED_PAD | Freq: Every day | CUTANEOUS | Status: DC
  Administered 2018-11-07 – 2018-11-08 (×2): 6 via TOPICAL

## 2018-11-07 MED ORDER — EZETIMIBE 10 MG PO TABS
10.0000 mg | ORAL_TABLET | Freq: Every day | ORAL | Status: DC
  Administered 2018-11-07 – 2018-11-10 (×4): 10 mg via ORAL
  Filled 2018-11-07 (×4): qty 1

## 2018-11-07 MED ORDER — PIPERACILLIN-TAZOBACTAM IN DEX 2-0.25 GM/50ML IV SOLN
2.2500 g | Freq: Four times a day (QID) | INTRAVENOUS | Status: DC
  Administered 2018-11-07 – 2018-11-09 (×9): 2.25 g via INTRAVENOUS
  Filled 2018-11-07 (×12): qty 50

## 2018-11-07 NOTE — Progress Notes (Signed)
  Echocardiogram 2D Echocardiogram has been performed.  Jeffrey Hicks 11/07/2018, 1:49 PM

## 2018-11-07 NOTE — Progress Notes (Signed)
Pharmacy Antibiotic Note  Jeffrey Hicks is a 73 y.o. male admitted on 11/06/2018 with sepsis.  Pharmacy has been consulted for  dosing.  Plan: Zosyn 3.375gm iv x1, then 2.25gm iv q6hr  Height: 5\' 10"  (177.8 cm) Weight: 205 lb (93 kg) IBW/kg (Calculated) : 73  Temp (24hrs), Avg:98.1 F (36.7 C), Min:98.1 F (36.7 C), Max:98.1 F (36.7 C)  Recent Labs  Lab 11/06/18 1821 11/06/18 2132  WBC 9.0  --   CREATININE 4.01*  --   LATICACIDVEN  --  2.1*    Estimated Creatinine Clearance: 18.8 mL/min (A) (by C-G formula based on SCr of 4.01 mg/dL (H)).    Allergies  Allergen Reactions  . Crestor  [Rosuvastatin Calcium] Other (See Comments)    Gout attack  . Lipitor  [Atorvastatin] Other (See Comments)    Gout Attack    Antimicrobials this admission: Zosyn 11/07/2018 >>   Dose changes/drug level info: Marland Kitchen  Dose adjustments this admission: -  Microbiology results: -  Thank you for allowing pharmacy to be a part of this patient's care.  Nani Skillern Crowford 11/07/2018 1:10 AM

## 2018-11-07 NOTE — ED Notes (Signed)
ED TO INPATIENT HANDOFF REPORT  ED Nurse Name and Phone #: William Hamburger   S Name/Age/Gender Jeffrey Hicks 73 y.o. male Room/Bed: WA11/WA11  Code Status   Code Status: Full Code  Home/SNF/Other Home Patient oriented to: self, place, time and situation Is this baseline? Yes   Triage Complete: Triage complete  Chief Complaint Weakness; Hypotension  Triage Note Pt BIBA from home.   Per EMS-  C/o dizziness, weakness, SHOB x 3 days.  Night sweats x 3days. Denies cough.  Denies fever, CP.    Initial BP was 60/40- pt received 1300 mL NaCl BP- then 93/64.  Pt AOx4, ambulatory on scene.    18 g L AC   Allergies Allergies  Allergen Reactions  . Crestor  [Rosuvastatin Calcium] Other (See Comments)    Gout attack  . Lipitor  [Atorvastatin] Other (See Comments)    Gout Attack    Level of Care/Admitting Diagnosis ED Disposition    ED Disposition Condition Comment   Admit  Hospital Area: [facility] [[id number]]  Level of Care: Stepdown [14]  Admit to SDU based on following criteria: Cardiac Instability:  Patients experiencing chest pain, unconfirmed MI and stable, arrhythmias and CHF requiring medical management and potentially compromising patient's stability  Covid Evaluation: Confirmed COVID Negative  Diagnosis: Sepsis [facility]) [[id number]]  Admitting Physician: Pearson Grippe [[id number]]  Attending Physician: Pearson Grippe [phone]  Estimated length of stay: 5 - 7 days  Certification:: I certify this patient will need inpatient services for at least 2 midnights  PT Class (Do Not Modify): Inpatient [101]  PT Acc Code (Do Not Modify): Private [1]       B Medical/Surgery History Past Medical History:  Diagnosis Date  . Hyperlipidemia   . Hypertension   . Left ventricular outflow tract obstruction    Past Surgical History:  Procedure Laterality Date  . OTHER SURGICAL HISTORY     no surgical HX     A IV Location/Drains/Wounds Patient Lines/Drains/Airways Status    Active Line/Drains/Airways    Name:   Placement date:   Placement time:   Site:   Days:   Peripheral IV 11/06/18 Left Antecubital   11/06/18    -    Antecubital   1   Peripheral IV 11/06/18 Right Antecubital   11/06/18    1839    Antecubital   1          Intake/Output Last 24 hours No intake or output data in the 24 hours ending 11/07/18 0018  Labs/Imaging Results for orders placed or performed during the hospital encounter of 11/06/18 (from the past 48 hour(s))  Basic metabolic panel     Status: Abnormal   Collection Time: 11/06/18  6:21 PM  Result Value Ref Range   Sodium 133 (L) 135 - 145 mmol/L   Potassium 3.5 3.5 - 5.1 mmol/L   Chloride 99 98 - 111 mmol/L   CO2 20 (L) 22 - 32 mmol/L   Glucose, Bld 148 (H) 70 - 99 mg/dL   BUN 53 (H) 8 - 23 mg/dL   Creatinine, Ser 9.52 (H) 0.61 - 1.24 mg/dL   Calcium 7.6 (L) 8.9 - 10.3 mg/dL   GFR calc non Af Amer 14 (L) >60 mL/min   GFR calc Af Amer 16 (L) >60 mL/min   Anion gap 14 5 - 15    Comment: Performed at [facility], [address], [place], [place] [address]  CBC WITH DIFFERENTIAL  Status: Abnormal   Collection Time: 11/06/18  6:21 PM  Result Value Ref Range   WBC 9.0 4.0 - 10.5 K/uL   RBC 3.52 (L) 4.22 - 5.81 MIL/uL   Hemoglobin 11.3 (L) 13.0 - 17.0 g/dL   HCT 35.8 (L) 39.0 - 52.0 %   MCV 101.7 (H) 80.0 - 100.0 fL   MCH 32.1 26.0 - 34.0 pg   MCHC 31.6 30.0 - 36.0 g/dL   RDW 14.4 11.5 - 15.5 %   Platelets 57 (L) 150 - 400 K/uL    Comment: REPEATED TO VERIFY PLATELET COUNT CONFIRMED BY SMEAR Immature Platelet Fraction may be clinically indicated, consider ordering this additional test [id number]    nRBC 0.0 0.0 - 0.2 %   Neutrophils Relative % 78 %   Neutro Abs 7.0 1.7 - 7.7 K/uL   Lymphocytes Relative 5 %   Lymphs Abs 0.5 (L) 0.7 - 4.0 K/uL   Monocytes Relative 9 %   Monocytes Absolute 0.8 0.1 - 1.0 K/uL   Eosinophils Relative 0 %   Eosinophils Absolute 0.0 0.0 - 0.5 K/uL   Basophils  Relative 0 %   Basophils Absolute 0.0 0.0 - 0.1 K/uL   WBC Morphology INCREASED BANDS (>20% BANDS)     Comment: VACUOLATED NEUTROPHILS   Immature Granulocytes 8 %   Abs Immature Granulocytes 0.70 (H) 0.00 - 0.07 K/uL   Burr Cells PRESENT     Comment: Performed at [facility], [place] [address] [place], [place] [address]  Troponin I (High Sensitivity)     Status: Abnormal   Collection Time: 11/06/18  6:21 PM  Result Value Ref Range   Troponin I (High Sensitivity) 29 (H) <18 ng/L    Comment: (NOTE) Elevated high sensitivity troponin I (hsTnI) values and significant  changes across serial measurements may suggest ACS but many other  chronic and acute conditions are known to elevate hsTnI results.  Refer to the "Links" section for chest pain algorithms and additional  guidance. Performed at [facility], [place] [address] [place], [place] [address]   Hepatic function panel     Status: Abnormal   Collection Time: 11/06/18  6:21 PM  Result Value Ref Range   Total Protein 5.8 (L) 6.5 - 8.1 g/dL   Albumin 2.7 (L) 3.5 - 5.0 g/dL   AST 23 15 - 41 U/L   ALT 19 0 - 44 U/L   Alkaline Phosphatase 92 38 - 126 U/L   Total Bilirubin 1.1 0.3 - 1.2 mg/dL   Bilirubin, Direct 0.7 (H) 0.0 - 0.2 mg/dL   Indirect Bilirubin 0.4 0.3 - 0.9 mg/dL    Comment: Performed at [facility], [place] [address], [place], [place] [address]  Lipase, blood     Status: None   Collection Time: 11/06/18  6:21 PM  Result Value Ref Range   Lipase 31 11 - 51 U/L    Comment: Performed at [facility], [place] [address], [place], [place] [address]  Urinalysis, Routine w reflex microscopic     Status: Abnormal   Collection Time: 11/06/18  6:22 PM  Result Value Ref Range   Color, Urine AMBER (A) YELLOW    Comment: BIOCHEMICALS MAY BE AFFECTED BY COLOR   APPearance CLOUDY (A) CLEAR   Specific Gravity, Urine 1.027 1.005 - 1.030   pH 5.0 5.0 - 8.0    Glucose, UA NEGATIVE NEGATIVE mg/dL   Hgb urine dipstick NEGATIVE NEGATIVE   Bilirubin Urine NEGATIVE  NEGATIVE   Ketones, ur NEGATIVE NEGATIVE mg/dL   Protein, ur 161100 (A) NEGATIVE mg/dL   Nitrite NEGATIVE NEGATIVE   Leukocytes,Ua TRACE (A) NEGATIVE   RBC / HPF 6-10 0 - 5 RBC/hpf   WBC, UA 11-20 0 - 5 WBC/hpf   Bacteria, UA RARE (A) NONE SEEN   Amorphous Crystal PRESENT     Comment: Performed at [facility], [address] [place], [place] [address]  Protime-INR     Status: Abnormal   Collection Time: 11/06/18  6:33 PM  Result Value Ref Range   Prothrombin Time 29.2 (H) 11.4 - 15.2 seconds   INR 2.8 (H) 0.8 - 1.2    Comment: (NOTE) INR goal varies based on device and disease states. Performed at [facility], [address] [place], [place] [address]   CBG monitoring, ED     Status: Abnormal   Collection Time: 11/06/18  6:47 PM  Result Value Ref Range   Glucose-Capillary 134 (H) 70 - 99 mg/dL  SARS Coronavirus 2 (CEPHEID - Performed in [facility] hospital lab), Hosp Order     Status: None   Collection Time: 11/06/18  6:49 PM   Specimen: Nasopharyngeal Swab  Result Value Ref Range   SARS Coronavirus 2 NEGATIVE NEGATIVE    Comment: (NOTE) If result is NEGATIVE SARS-CoV-2 target nucleic acids are NOT DETECTED. The SARS-CoV-2 RNA is generally detectable in upper and lower  respiratory specimens during the acute phase of infection. The lowest  concentration of SARS-CoV-2 viral copies this assay can detect is 250  copies / mL. A negative result does not preclude SARS-CoV-2 infection  and should not be used as the sole basis for treatment or other  patient management decisions.  A negative result may occur with  improper specimen collection / handling, submission of specimen other  than nasopharyngeal swab, presence of viral mutation(s) within the  areas targeted by this assay, and inadequate number of viral copies  (<250 copies / mL). A  negative result must be combined with clinical  observations, patient history, and epidemiological information. If result is POSITIVE SARS-CoV-2 target nucleic acids are DETECTED. The SARS-CoV-2 RNA is generally detectable in upper and lower  respiratory specimens dur ing the acute phase of infection.  Positive  results are indicative of active infection with SARS-CoV-2.  Clinical  correlation with patient history and other diagnostic information is  necessary to determine patient infection status.  Positive results do  not rule out bacterial infection or co-infection with other viruses. If result is PRESUMPTIVE POSTIVE SARS-CoV-2 nucleic acids MAY BE PRESENT.   A presumptive positive result was obtained on the submitted specimen  and confirmed on repeat testing.  While 2019 novel coronavirus  (SARS-CoV-2) nucleic acids may be present in the submitted sample  additional confirmatory testing may be necessary for epidemiological  and / or clinical management purposes  to differentiate between  SARS-CoV-2 and other Sarbecovirus currently known to infect humans.  If clinically indicated additional testing with an alternate test  methodology [phone]([id number]) is advised. The SARS-CoV-2 RNA is generally  detectable in upper and lower respiratory sp ecimens during the acute  phase of infection. The expected result is Negative. Fact Sheet for Patients:  BoilerBrush.com.cyhttps://www.fda.gov/media/136312/download Fact Sheet for Healthcare Providers: https://pope.com/https://www.fda.gov/media/136313/download This test is not yet approved or cleared by the [place] FDA and has been authorized for detection and/or diagnosis of SARS-CoV-2 by FDA under an Emergency Use Authorization (EUA).  This EUA will remain in  effect (meaning this test can be used) for the duration of the COVID-19 declaration under Section 564(b)(1) of the Act, 21 U.S.C. section 360bbb-3(b)(1), unless the authorization is terminated or revoked sooner. Performed  at [facility], [address] [place], [place] [address]   POC occult blood, ED Provider will collect     Status: None   Collection Time: 11/06/18  7:59 PM  Result Value Ref Range   Fecal Occult Bld NEGATIVE NEGATIVE  Troponin I (High Sensitivity)     Status: Abnormal   Collection Time: 11/06/18  8:23 PM  Result Value Ref Range   Troponin I (High Sensitivity) 26.0 (H) <18 ng/L    Comment: (NOTE) Elevated high sensitivity troponin I (hsTnI) values and significant  changes across serial measurements may suggest ACS but many other  chronic and acute conditions are known to elevate hsTnI results.  Refer to the "Links" section for chest pain algorithms and additional  guidance. Performed at [facility], [address] [place], [place] [address]   Lactic acid, plasma     Status: Abnormal   Collection Time: 11/06/18  9:32 PM  Result Value Ref Range   Lactic Acid, Venous 2.1 (HH) 0.5 - 1.9 mmol/L    Comment: CRITICAL RESULT CALLED TO, READ BACK BY AND VERIFIED WITH: HODGES AT 2302 ON 11/06/2018 BY JPM Performed at [facility], [address] [place], [place] [address]   Sodium, urine, random     Status: None   Collection Time: 11/06/18 11:44 PM  Result Value Ref Range   Sodium, Ur 18 mmol/L    Comment: Performed at [facility], [address] [place], [place] [address]   Ct Abdomen Pelvis Wo Contrast  Result Date: 11/06/2018 CLINICAL DATA:  73 year old male with chest and back pain and weakness and shortness of breath. EXAM: CT CHEST, ABDOMEN AND PELVIS WITHOUT CONTRAST TECHNIQUE: Multidetector CT imaging of the chest, abdomen and pelvis was performed following the standard protocol without IV contrast. COMPARISON:  Radiograph dated 01/27/2005 and CT of the abdomen pelvis dated 01/26/2005 FINDINGS: Evaluation of this exam is limited in the absence of intravenous contrast. CT CHEST FINDINGS Cardiovascular:  There is mild cardiomegaly. No significant pericardial effusion. Coronary vascular calcification primarily involving the LAD and left circumflex artery. There is atherosclerotic calcification of the aortic valve leaflets as well as moderate atherosclerotic calcification of the thoracic aorta. The central pulmonary arteries are grossly unremarkable. Mediastinum/Nodes: There is no hilar or mediastinal adenopathy. Several top-normal mediastinal lymph nodes measure up to 11 mm in short axis. The esophagus is grossly unremarkable. No mediastinal fluid collection. Lungs/Pleura: The lungs are clear. There is no pleural effusion or pneumothorax. The central airways are patent. There is apparent narrowing of the right middle lobe bronchus in the right hilum of indeterminate etiology. Although this may be related to chronic inflammation. An endobronchial or a small hilar lesion is not entirely excluded. This can be better evaluated with bronchoscopy on a nonemergent basis. Musculoskeletal: There is degenerative changes of the spine. No acute osseous pathology. CT ABDOMEN PELVIS FINDINGS No intra-abdominal free air or free fluid. Hepatobiliary: There is diffuse fatty infiltration of the liver. No intrahepatic biliary ductal dilatation. The gallbladder is partially contracted. Probable faint noncalcified stone in the gallbladder fundus. There is inflammatory changes surrounding the gallbladder. Findings concerning for acute cholecystitis. Further evaluation with ultrasound is recommended. Pancreas: Unremarkable. No pancreatic ductal dilatation or surrounding inflammatory changes. Spleen: Normal in size without  focal abnormality. Adrenals/Urinary Tract: The adrenal glands are unremarkable. There is no hydronephrosis or nephrolithiasis on either side. Multiple bilateral renal hypodense lesions measure up to 5 cm in the interpolar aspect of the right kidney. These lesions are suboptimally evaluated on this noncontrast CT but  demonstrate fluid attenuation most consistent with cysts. Ultrasound may provide better characterization on a nonemergent basis. The visualized ureters appear unremarkable. The urinary bladder is collapsed. There is apparent diffuse thickening of the bladder wall which may be partly related to underdistention. Cystitis is not excluded. Correlation with urinalysis recommended. Stomach/Bowel: There is sigmoid diverticulosis with muscular hypertrophy. No active inflammatory changes. There are scattered colonic diverticula without active inflammatory changes. There is no bowel obstruction. The appendix is normal. Vascular/Lymphatic: Advanced aortoiliac atherosclerotic disease. No portal venous gas. There is no adenopathy. Reproductive: Mildly enlarged prostate gland measuring 4.7 cm in transverse axial diameter. The seminal vesicles are symmetric. Other: None Musculoskeletal: Degenerative changes of the spine. No acute osseous pathology. IMPRESSION: 1. No acute intrathoracic pathology. 2. Inflammatory changes surrounding the gallbladder concerning for acute cholecystitis. Further evaluation with ultrasound is recommended. Noncalcified stones may be present within the gallbladder. 3. Apparent mild narrowing of the right middle lobe bronchus in the right hilum. This can be further evaluated with bronchoscopy on a nonemergent basis. 4. No hydronephrosis or nephrolithiasis. 5. Fatty liver. 6. Colonic diverticulosis. No bowel obstruction or active inflammation. Normal appendix. 7. Aortic Atherosclerosis (ICD10-I70.0). Electronically Signed   By: Elgie CollardArash  Radparvar M.D.   On: 11/06/2018 20:32   Ct Chest Wo Contrast  Result Date: 11/06/2018 CLINICAL DATA:  73 year old male with chest and back pain and weakness and shortness of breath. EXAM: CT CHEST, ABDOMEN AND PELVIS WITHOUT CONTRAST TECHNIQUE: Multidetector CT imaging of the chest, abdomen and pelvis was performed following the standard protocol without IV contrast.  COMPARISON:  Radiograph dated 01/27/2005 and CT of the abdomen pelvis dated 01/26/2005 FINDINGS: Evaluation of this exam is limited in the absence of intravenous contrast. CT CHEST FINDINGS Cardiovascular: There is mild cardiomegaly. No significant pericardial effusion. Coronary vascular calcification primarily involving the LAD and left circumflex artery. There is atherosclerotic calcification of the aortic valve leaflets as well as moderate atherosclerotic calcification of the thoracic aorta. The central pulmonary arteries are grossly unremarkable. Mediastinum/Nodes: There is no hilar or mediastinal adenopathy. Several top-normal mediastinal lymph nodes measure up to 11 mm in short axis. The esophagus is grossly unremarkable. No mediastinal fluid collection. Lungs/Pleura: The lungs are clear. There is no pleural effusion or pneumothorax. The central airways are patent. There is apparent narrowing of the right middle lobe bronchus in the right hilum of indeterminate etiology. Although this may be related to chronic inflammation. An endobronchial or a small hilar lesion is not entirely excluded. This can be better evaluated with bronchoscopy on a nonemergent basis. Musculoskeletal: There is degenerative changes of the spine. No acute osseous pathology. CT ABDOMEN PELVIS FINDINGS No intra-abdominal free air or free fluid. Hepatobiliary: There is diffuse fatty infiltration of the liver. No intrahepatic biliary ductal dilatation. The gallbladder is partially contracted. Probable faint noncalcified stone in the gallbladder fundus. There is inflammatory changes surrounding the gallbladder. Findings concerning for acute cholecystitis. Further evaluation with ultrasound is recommended. Pancreas: Unremarkable. No pancreatic ductal dilatation or surrounding inflammatory changes. Spleen: Normal in size without focal abnormality. Adrenals/Urinary Tract: The adrenal glands are unremarkable. There is no hydronephrosis or  nephrolithiasis on either side. Multiple bilateral renal hypodense lesions measure up to 5 cm in the interpolar aspect  of the right kidney. These lesions are suboptimally evaluated on this noncontrast CT but demonstrate fluid attenuation most consistent with cysts. Ultrasound may provide better characterization on a nonemergent basis. The visualized ureters appear unremarkable. The urinary bladder is collapsed. There is apparent diffuse thickening of the bladder wall which may be partly related to underdistention. Cystitis is not excluded. Correlation with urinalysis recommended. Stomach/Bowel: There is sigmoid diverticulosis with muscular hypertrophy. No active inflammatory changes. There are scattered colonic diverticula without active inflammatory changes. There is no bowel obstruction. The appendix is normal. Vascular/Lymphatic: Advanced aortoiliac atherosclerotic disease. No portal venous gas. There is no adenopathy. Reproductive: Mildly enlarged prostate gland measuring 4.7 cm in transverse axial diameter. The seminal vesicles are symmetric. Other: None Musculoskeletal: Degenerative changes of the spine. No acute osseous pathology. IMPRESSION: 1. No acute intrathoracic pathology. 2. Inflammatory changes surrounding the gallbladder concerning for acute cholecystitis. Further evaluation with ultrasound is recommended. Noncalcified stones may be present within the gallbladder. 3. Apparent mild narrowing of the right middle lobe bronchus in the right hilum. This can be further evaluated with bronchoscopy on a nonemergent basis. 4. No hydronephrosis or nephrolithiasis. 5. Fatty liver. 6. Colonic diverticulosis. No bowel obstruction or active inflammation. Normal appendix. 7. Aortic Atherosclerosis (ICD10-I70.0). Electronically Signed   By: Elgie Collard M.D.   On: 11/06/2018 20:32   Dg Chest Port 1 View  Result Date: 11/06/2018 CLINICAL DATA:  Dizziness, weakness, shortness of breath. EXAM: PORTABLE CHEST 1  VIEW COMPARISON:  None. FINDINGS: Normal heart size with normal mediastinal contours. No focal airspace disease, pleural effusion or pneumothorax. No pulmonary edema. No acute osseous abnormalities. IMPRESSION: No acute chest finding. Electronically Signed   By: Narda Rutherford M.D.   On: 11/06/2018 19:56   US Abdomen Limited Ruq  Result Date: 11/06/2018 CLINICAL DATA:  Right upper quadrant pain EXAM: ULTRASOUND ABDOMEN LIMITED RIGHT UPPER QUADRANT COMPARISON:  None. FINDINGS: Gallbladder: There is diffuse gallbladder wall thickening with the gallbladder measuring approximately 1 cm in thickness. There are no gallstones. The sonographic Eulah Pont sign is negative. There is mild pericholecystic free fluid. Common bile duct: Diameter: 0.3 cm Liver: Diffuse increased echogenicity with slightly heterogeneous liver. Appearance typically secondary to fatty infiltration. Fibrosis secondary consideration. No secondary findings of cirrhosis noted. No focal hepatic lesion or intrahepatic biliary duct dilatation. Portal vein is patent on color Doppler imaging with normal direction of blood flow towards the liver. IMPRESSION: 1. Nonspecific gallbladder wall thickening in the absence of gallstones or a positive sonographic Murphy sign. 2. Hepatic steatosis. Electronically Signed   By: Katherine Mantle M.D.   On: 11/06/2018 21:44    Pending Labs Unresulted Labs (From admission, onward)    Start     Ordered   11/07/18 0500  Comprehensive metabolic panel  Tomorrow morning,   R     11/06/18 2343   11/07/18 0500  CBC  Tomorrow morning,   R     11/06/18 2343   11/07/18 0500  Protein electrophoresis, serum  Tomorrow morning,   R     11/06/18 2344   11/06/18 2345  UPEP/UIFE/Light Chains/TP, 24-Hr Ur  Once,   STAT     11/06/18 2344   11/06/18 2344  Protein / creatinine ratio, urine  Once,   STAT     11/06/18 2343   11/06/18 2132  Culture, blood (routine x 2)  BLOOD CULTURE X 2,   STAT     11/06/18 2131   11/06/18  2132  Lactic acid, plasma  Now  then every 2 hours,   STAT     11/06/18 2131          Vitals/Pain Today's Vitals   11/06/18 2230 11/06/18 2245 11/06/18 2315 11/06/18 2330  BP: (!) 84/51 (!) 82/55 104/64 (!) 87/60  Pulse: 87 85 85 93  Resp: 19 (!) Temp:      TempSrc:      SpO2: 100% 100% 95% 98%  Weight:      Height:      PainSc:        Isolation Precautions No active isolations  Medications Medications  sodium chloride 0.9 % bolus 1,000 mL (0 mLs Intravenous Stopped 11/06/18 1950)    And  0.9 %  sodium chloride infusion ( Intravenous New Bag/Given 11/06/18 1842)  0.9 %  sodium chloride infusion ( Intravenous New Bag/Given 11/07/18 0018)  acetaminophen (TYLENOL) tablet 650 mg (has no administration in time range)    Or  acetaminophen (TYLENOL) suppository 650 mg (has no administration in time range)  sodium chloride 0.9 % bolus 1,000 mL (0 mLs Intravenous Stopped 11/06/18 1950)  sodium chloride 0.9 % bolus 1,000 mL (0 mLs Intravenous Stopped 11/06/18 2113)  sodium chloride 0.9 % bolus 1,000 mL (0 mLs Intravenous Stopped 11/06/18 2319)  piperacillin-tazobactam (ZOSYN) IVPB 3.375 g (0 g Intravenous Stopped 11/06/18 2302)    Mobility walks Low fall risk   Focused Assessment   R Recommendations: See Admitting Provider Note  Report given to:   Additional Notes:

## 2018-11-07 NOTE — Progress Notes (Signed)
PROGRESS NOTE    ARGUS CARAHER  ZOX:[id number] DOB: 04-13-1946 DOA: 11/06/2018 PCP: Curly Rim, MD  Brief Narrative: HPI per Dr. Jani Gravel on 11/06/2018   Jeffrey Hicks  is a 73 y.o. male, w hypertension, hyperlipidemia, Pafib, apparently c/o feeling lightheaded, and generally weak as well as sweats.  The sweats have been occurring since Thursday. The lightheadedness occurred yesterday.  Pt denies fever, cough, cp, palp, sob, n/v, abd pain, diarrhea, flank pain, dysuria, hematuria.  Pt does note slight increase in frequency of urination.  Pt notes that he takes OTC aleve.  He also has a few drinks at nite.   In ED,  T 98.1, P 51-102  R 26  Bp 82/58  Pox 97% on RA Wt 93kg  CT chest/ abd/ pelvis IMPRESSION: 1. No acute intrathoracic pathology. 2. Inflammatory changes surrounding the gallbladder concerning for acute cholecystitis. Further evaluation with ultrasound is recommended. Noncalcified stones may be present within the gallbladder. 3. Apparent mild narrowing of the right middle lobe bronchus in the right hilum. This can be further evaluated with bronchoscopy on a nonemergent basis. 4. No hydronephrosis or nephrolithiasis. 5. Fatty liver. 6. Colonic diverticulosis. No bowel obstruction or active inflammation. Normal appendix. 7. Aortic Atherosclerosis (ICD10-I70.0).  RUQ ultrasound IMPRESSION: 1. Nonspecific gallbladder wall thickening in the absence of gallstones or a positive sonographic Murphy sign. 2. Hepatic steatosis.  Na 133, K 3.5,  Bun 53, Creatinine 4.01 (previously 1.52 09/15/2016) Glucose 148 calcium 7.6 Alb 2.7,  Ast 23, Alt 19, Alk phos 92, T. Bili 1.1  Urinalysis prot 100,  Wbc 11-20, rbc 6-10 INR 2.8   Wbc 9.0, Hgb 11.3, mcv 101.7, Plt 57 Trop 29  => 26  Pt will be admitted for ARF, and sepsis (tachycardia, hypotension, elevated lactic acid, bandemia) secondary to UTI  **Interim History  Patient was eating breakfast last  vomiting renal function really had not changed significantly.  We will continue IV fluid hydration and obtain renal ultrasound.  If renal function is not improving significantly will consult nephrology for further evaluation  Assessment & Plan:   Principal Problem:   ARF (acute renal failure) ([facility]) Active Problems:   Hyperlipidemia   Hypertension   PAF (paroxysmal atrial fibrillation) (HCC)   Hypotension   Acute lower UTI   Anemia   Thrombocytopenia (HCC)   Sepsis (HCC)  Acute Kidney Injury Metabolic Acidosis, slightly worsened  -Check urine sodium, urine creatinine, urine prot, urine eosinophils -Check spep, immunofixation -Check CPK -STOP Lisinopril -STOP Hydrochlorothiazide -STOP OTC Aleve -Hydrate with ns iv -Check renal ultrasound and showed "Bilateral exophytic renal cysts as described. No solid mass lesion or stone in either kidney. No obstruction.  Renal parenchyma is within normal limits for age." -Checking urine for light chains -Patient is BUN/creatinine 1.53/1.01 is now 36/1.01 -Continue monitor and trend renal function -Avoid nephrotoxic medications, contrast dyes and hypotension -The patient was hypotensive earlier -Repeat CMP in AM   Hypotension likely secondary to sepsis -Check trop , cortisol -Echocardiogram as below  Sepsis likely secondary to uti -Blood culture x2 pending -States his biggest complaint last few days was fever and chills and then became very weak and almost passed out and fell backward on his couch due to weakness -Urinalysis showed cloudy appearance with amber color urine, trace leukocytes, rare bacteria, 11-20 WBCs, 6-10 RBCs, -Check urine culture -F/u lactic acid -Zosyn iv pharmacy to dose -No Vanco for now due to impaired renal function  Pafib -Hold carvedilol due to hypotension, can restart  if bp better with holding parameters -Hold Eliquis temporarily til see what plt count is in the AM as well as due to ARF  Gout -Hold  Allopurinol temporarily due to ARF -Hold Colchicine  Hyperlipidemia -Cont Zetia   Narrowing of the right middle lobe bronchus in the right hilum -This can be further evaluated with bronchoscopy on a nonemergent basis.  ETOH use -We will need to continue to monitor for signs of withdrawal   Obesity -Estimated body mass index is 31.92 kg/m as calculated from the following:   Height as of this encounter: 5' 10"  (1.778 m).   Weight as of this encounter: 100.9 kg. -Weight loss and Dietary Counseling given  Hyponatremia -Was Mild at 133. Improved to 135 -C/w IVF Hydration rate was decreased from 150 mL's per hour to 75 mils per hour -Continue to Monitor and Trend and Repeat CMP in AM  Macrocytic Anemia -Patient's Hgb/Hct went from 11.3/35.8 -> 11.5/35.9 -Check Anemia Panel in the AM -Checked LDH and was 133 -Continue to Monitor for S/Sx of Bleeding; Currently no overt bleeding noted -Repeat CBC in AM   Thrombocytopenia -Patient's Platelet Count was 57 on Admission and repeat was 67 -Continue to Monitor for S/Sx of Bleeding -Repeat CBC in AM    DVT prophylaxis: SCDs Code Status: FULL CODE Family Communication: No family present at bedside  Disposition Plan: Remain in SDU for now for closer monitoring   Consultants:   None   Procedures:  ECHOCARDIOGRAM  IMPRESSIONS    1. The left ventricle has normal systolic function with an ejection fraction of 60-65%. The cavity size was normal. Left ventricular diastolic function could not be evaluated. Elevated mean left atrial pressure.  2. The right ventricle has normal systolic function. The cavity was normal.  3. The mitral valve is abnormal. There is mild mitral annular calcification present.  4. The aortic root is normal in size and structure.  5. The tricuspid valve is grossly normal.  6. The aortic valve is tricuspid. Mild thickening of the aortic valve. No stenosis of the aortic valve.  7. The interatrial septum  appears to be lipomatous.  8. Left atrial size was moderately dilated.  9. Right atrial size was mildly dilated. 10. The inferior vena cava was dilated in size with >50% respiratory variability. 11. Normal LV systolic function; proximal septal thickening; biatrial enlargement; mild MR and TR; mildly elevated pulmonary pressure.  FINDINGS  Left Ventricle: The left ventricle has normal systolic function, with an ejection fraction of 60-65%. The cavity size was normal. There is no increase in left ventricular wall thickness. Left ventricular diastolic function could not be evaluated.  Elevated mean left atrial pressure  Right Ventricle: The right ventricle has normal systolic function. The cavity was normal.  Left Atrium: Left atrial size was moderately dilated.  Right Atrium: Right atrial size was mildly dilated. Right atrial pressure is estimated at 8 mmHg.  Interatrial Septum: No atrial level shunt detected by color flow Doppler. Increased thickness of the atrial septum sparing the fossa ovalis consistent with The interatrial septum appears to be lipomatous.  Pericardium: There is no evidence of pericardial effusion.  Mitral Valve: The mitral valve is abnormal. There is mild mitral annular calcification present. Mitral valve regurgitation is mild by color flow Doppler.  Tricuspid Valve: The tricuspid valve is grossly normal. Tricuspid valve regurgitation is mild by color flow Doppler.  Aortic Valve: The aortic valve is tricuspid Mild thickening of the aortic valve. Aortic valve regurgitation was not  visualized by color flow Doppler. There is No stenosis of the aortic valve.  Pulmonic Valve: The pulmonic valve was not well visualized. Pulmonic valve regurgitation is not visualized by color flow Doppler.  Aorta: The aortic root is normal in size and structure.  Venous: The inferior vena cava is dilated in size with greater than 50% respiratory variability.  Additional  Comments: Normal LV systolic function; proximal septal thickening; biatrial enlargement; mild MR and TR; mildly elevated pulmonary pressure.    +--------------+--------++ LEFT VENTRICLE         +----------------+---------++ +--------------+--------++ Diastology                PLAX 2D                +----------------+---------++ +--------------+--------++ LV e' lateral:  8.19 cm/s LVIDd:        4.70 cm  +----------------+---------++ +--------------+--------++ LV E/e' lateral:16.5      LVIDs:        3.20 cm  +----------------+---------++ +--------------+--------++ LV e' medial:   6.08 cm/s LV PW:        1.10 cm  +----------------+---------++ +--------------+--------++ LV E/e' medial: 22.2      LV IVS:       1.20 cm  +----------------+---------++ +--------------+--------++ LVOT diam:    2.50 cm  +--------------+--------++ LV SV:        61 ml    +--------------+--------++ LV SV Index:  27.14    +--------------+--------++ LVOT Area:    4.91 cm +--------------+--------++                        +--------------+--------++  +---------------+---------++ RIGHT VENTRICLE          +---------------+---------++ RV Basal diam: 3.20 cm   +---------------+---------++ RV S prime:    5.35 cm/s +---------------+---------++ TAPSE (M-mode):2.8 cm    +---------------+---------++ RVSP:          38.3 mmHg +---------------+---------++  +---------------+--------++-----------++ LEFT ATRIUM            Index       +---------------+--------++-----------++ LA diam:       5.50 cm 2.52 cm/m  +---------------+--------++-----------++ LA Vol (A2C):  75.4 ml 34.53 ml/m +---------------+--------++-----------++ LA Vol (A4C):  105.0 ml48.08 ml/m +---------------+--------++-----------++ LA Biplane Vol:93.1 ml 42.63  ml/m +---------------+--------++-----------++ +------------+---------+++ RIGHT ATRIUM          +------------+---------+++ RA Pressure:8.00 mmHg +------------+---------+++  +------------+-----------++ AORTIC VALVE            +------------+-----------++ LVOT Vmax:  88.60 cm/s  +------------+-----------++ LVOT Vmean: 57.800 cm/s +------------+-----------++ LVOT VTI:   0.161 m     +------------+-----------++   +-------------+-------++ AORTA                +-------------+-------++ Ao Root diam:3.20 cm +-------------+-------++  +--------------+----------++  +---------------+-----------++ MITRAL VALVE              TRICUSPID VALVE            +--------------+----------++  +---------------+-----------++ MV Area (PHT):4.80 cm    TR Peak grad:  30.3 mmHg   +--------------+----------++  +---------------+-----------++ MV PHT:       45.82 msec  TR Vmax:       295.00 cm/s +--------------+----------++  +---------------+-----------++ MV Decel Time:158 msec    Estimated RAP: 8.00 mmHg   +--------------+----------++  +---------------+-----------++ +--------------+-----------++ RVSP:          38.3 mmHg   MV E velocity:135.00 cm/s +---------------+-----------++ +--------------+-----------++ MV A velocity:30.60 cm/s  +--------------+-------+ +--------------+-----------++ SHUNTS  MV E/A ratio: 4.41        +--------------+-------+ +--------------+-----------++ Systemic VTI: 0.16 m                                +--------------+-------+                               Systemic Diam:2.50 cm                               +--------------+-------+   Antimicrobials:  Anti-infectives (From admission, onward)   Start     Dose/Rate Route Frequency Ordered Stop   11/07/18 0600  piperacillin-tazobactam (ZOSYN) IVPB 2.25 g     2.25 g 100 mL/hr over 30 Minutes Intravenous Every 6 hours 11/07/18 0110      11/06/18 2145  piperacillin-tazobactam (ZOSYN) IVPB 3.375 g     3.375 g 100 mL/hr over 30 Minutes Intravenous  Once 11/06/18 2130 11/06/18 2302     Subjective: Seen and examined at bedside and states that he is doing okay denies any chest pain, lightheadedness or dizziness but states that he fell backwards because he became weak when he stood up.  Has been taking Aleve for pain and states that he may take in a little too many.  No nausea or vomiting.  Denies any diarrhea.  No other concerns reported at this time is tolerating breakfast without issue  Objective: Vitals:   11/07/18 0100 11/07/18 0128 11/07/18 0400 11/07/18 0630  BP: 103/66   106/65  Pulse: 85  90 89  Resp: (!) 24  14 16   Temp:   97.7 F (36.5 C)   TempSrc:   Oral   SpO2: 97%  100% 100%  Weight:  100.9 kg    Height:        Intake/Output Summary (Last 24 hours) at 11/07/2018 0730 Last data filed at 11/07/2018 0600 Gross per 24 hour  Intake 1855.6 ml  Output 20 ml  Net 1835.6 ml   Filed Weights   11/06/18 1819 11/07/18 0128  Weight: 93 kg 100.9 kg   Examination: Physical Exam:  Constitutional: WN/WD obese Caucasian male currently NAD and appears calm and comfortable Eyes: Lds and conjunctivae normal, sclerae anicteric  ENMT: External Ears, Nose appear normal. Grossly normal hearing.  Neck: Appears normal, supple, no cervical masses, normal ROM, no appreciable thyromegaly; no JVD Respiratory: Diminished to auscultation bilaterally, no wheezing, rales, rhonchi or crackles. Normal respiratory effort and patient is not tachypenic. No accessory muscle use.  Cardiovascular: RRR, no murmurs / rubs / gallops. S1 and S2 auscultated. 1+ extremity edema.  Abdomen: Soft, mildly tender, Distended 2/2 body habitus. No masses palpated. No appreciable hepatosplenomegaly. Bowel sounds positive x4.  GU: Deferred. Musculoskeletal: No clubbing / cyanosis of digits/nails. No joint deformity upper and lower extremities.  Skin:  No rashes, lesions, ulcers on a limited skin evaluation. No induration; Warm and dry.  Neurologic: CN 2-12 grossly intact with no focal deficits. . Romberg sign and cerebellar reflexes not assessed.  Psychiatric: Normal judgment and insight. Alert and oriented x 3. Normal mood and appropriate affect.   Data Reviewed: I have personally reviewed following labs and imaging studies  CBC: Recent Labs  Lab 11/06/18 1821 11/07/18 0234  WBC 9.0 8.5  NEUTROABS 7.0  --   HGB 11.3* 11.5*  HCT 35.8*  35.9*  MCV 101.7* 101.7*  PLT 57* 67*   Basic Metabolic Panel: Recent Labs  Lab 11/06/18 1821 11/07/18 0234  NA 133* 135  K 3.5 4.4  CL 99 104  CO2 20* 18*  GLUCOSE 148* 121*  BUN 53* 56*  CREATININE 4.01* 4.01*  CALCIUM 7.6* 7.3*   GFR: Estimated Creatinine Clearance: 19.5 mL/min (A) (by C-G formula based on SCr of 4.01 mg/dL (H)). Liver Function Tests: Recent Labs  Lab 11/06/18 1821 11/07/18 0234  AST 23 37  ALT 19 29  ALKPHOS 92 74  BILITOT 1.1 1.2  PROT 5.8* 5.7*  ALBUMIN 2.7* 2.7*   Recent Labs  Lab 11/06/18 1821  LIPASE 31   No results for input(s): AMMONIA in the last 168 hours. Coagulation Profile: Recent Labs  Lab 11/06/18 1833  INR 2.8*   Cardiac Enzymes: No results for input(s): CKTOTAL, CKMB, CKMBINDEX, TROPONINI in the last 168 hours. BNP (last 3 results) No results for input(s): PROBNP in the last 8760 hours. HbA1C: No results for input(s): HGBA1C in the last 72 hours. CBG: Recent Labs  Lab 11/06/18 1847  GLUCAP 134*   Lipid Profile: No results for input(s): CHOL, HDL, LDLCALC, TRIG, CHOLHDL, LDLDIRECT in the last 72 hours. Thyroid Function Tests: No results for input(s): TSH, T4TOTAL, FREET4, T3FREE, THYROIDAB in the last 72 hours. Anemia Panel: No results for input(s): VITAMINB12, FOLATE, FERRITIN, TIBC, IRON, RETICCTPCT in the last 72 hours. Sepsis Labs: Recent Labs  Lab 11/06/18 2132 11/07/18 0234  LATICACIDVEN 2.1* 1.6    Recent  Results (from the past 240 hour(s))  SARS Coronavirus 2 (CEPHEID - Performed in [facility] hospital lab), Hosp Order     Status: None   Collection Time: 11/06/18  6:49 PM   Specimen: Nasopharyngeal Swab  Result Value Ref Range Status   SARS Coronavirus 2 NEGATIVE NEGATIVE Final    Comment: (NOTE) If result is NEGATIVE SARS-CoV-2 target nucleic acids are NOT DETECTED. The SARS-CoV-2 RNA is generally detectable in upper and lower  respiratory specimens during the acute phase of infection. The lowest  concentration of SARS-CoV-2 viral copies this assay can detect is 250  copies / mL. A negative result does not preclude SARS-CoV-2 infection  and should not be used as the sole basis for treatment or other  patient management decisions.  A negative result may occur with  improper specimen collection / handling, submission of specimen other  than nasopharyngeal swab, presence of viral mutation(s) within the  areas targeted by this assay, and inadequate number of viral copies  (<250 copies / mL). A negative result must be combined with clinical  observations, patient history, and epidemiological information. If result is POSITIVE SARS-CoV-2 target nucleic acids are DETECTED. The SARS-CoV-2 RNA is generally detectable in upper and lower  respiratory specimens dur ing the acute phase of infection.  Positive  results are indicative of active infection with SARS-CoV-2.  Clinical  correlation with patient history and other diagnostic information is  necessary to determine patient infection status.  Positive results do  not rule out bacterial infection or co-infection with other viruses. If result is PRESUMPTIVE POSTIVE SARS-CoV-2 nucleic acids MAY BE PRESENT.   A presumptive positive result was obtained on the submitted specimen  and confirmed on repeat testing.  While 2019 novel coronavirus  (SARS-CoV-2) nucleic acids may be present in the submitted sample  additional confirmatory testing may  be necessary for epidemiological  and / or clinical management purposes  to differentiate between  SARS-CoV-2  and other Sarbecovirus currently known to infect humans.  If clinically indicated additional testing with an alternate test  methodology [phone]) is advised. The SARS-CoV-2 RNA is generally  detectable in upper and lower respiratory sp ecimens during the acute  phase of infection. The expected result is Negative. Fact Sheet for Patients:  StrictlyIdeas.no Fact Sheet for Healthcare Providers: BankingDealers.co.za This test is not yet approved or cleared by the [place] FDA and has been authorized for detection and/or diagnosis of SARS-CoV-2 by FDA under an Emergency Use Authorization (EUA).  This EUA will remain in effect (meaning this test can be used) for the duration of the COVID-19 declaration under Section 564(b)(1) of the Act, 21 U.S.C. section 360bbb-3(b)(1), unless the authorization is terminated or revoked sooner. Performed at [facility], [place] [employer] [address] [place], [place] [address]   MRSA PCR Screening     Status: None   Collection Time: 11/07/18  1:21 AM   Specimen: Nasal Mucosa; Nasopharyngeal  Result Value Ref Range Status   MRSA by PCR NEGATIVE NEGATIVE Final    Comment:        The GeneXpert MRSA Assay (FDA approved for NASAL specimens only), is one component of a comprehensive MRSA colonization surveillance program. It is not intended to diagnose MRSA infection nor to guide or monitor treatment for MRSA infections. Performed at [facility], [place] [address], [place], [place] [address]     Radiology Studies: Ct Abdomen Pelvis Wo Contrast  Result Date: 11/06/2018 CLINICAL DATA:  73 year old male with chest and back pain and weakness and shortness of breath. EXAM: CT CHEST, ABDOMEN AND PELVIS WITHOUT CONTRAST TECHNIQUE: Multidetector CT imaging of the chest, abdomen  and pelvis was performed following the standard protocol without IV contrast. COMPARISON:  Radiograph dated 01/27/2005 and CT of the abdomen pelvis dated 01/26/2005 FINDINGS: Evaluation of this exam is limited in the absence of intravenous contrast. CT CHEST FINDINGS Cardiovascular: There is mild cardiomegaly. No significant pericardial effusion. Coronary vascular calcification primarily involving the LAD and left circumflex artery. There is atherosclerotic calcification of the aortic valve leaflets as well as moderate atherosclerotic calcification of the thoracic aorta. The central pulmonary arteries are grossly unremarkable. Mediastinum/Nodes: There is no hilar or mediastinal adenopathy. Several top-normal mediastinal lymph nodes measure up to 11 mm in short axis. The esophagus is grossly unremarkable. No mediastinal fluid collection. Lungs/Pleura: The lungs are clear. There is no pleural effusion or pneumothorax. The central airways are patent. There is apparent narrowing of the right middle lobe bronchus in the right hilum of indeterminate etiology. Although this may be related to chronic inflammation. An endobronchial or a small hilar lesion is not entirely excluded. This can be better evaluated with bronchoscopy on a nonemergent basis. Musculoskeletal: There is degenerative changes of the spine. No acute osseous pathology. CT ABDOMEN PELVIS FINDINGS No intra-abdominal free air or free fluid. Hepatobiliary: There is diffuse fatty infiltration of the liver. No intrahepatic biliary ductal dilatation. The gallbladder is partially contracted. Probable faint noncalcified stone in the gallbladder fundus. There is inflammatory changes surrounding the gallbladder. Findings concerning for acute cholecystitis. Further evaluation with ultrasound is recommended. Pancreas: Unremarkable. No pancreatic ductal dilatation or surrounding inflammatory changes. Spleen: Normal in size without focal abnormality. Adrenals/Urinary  Tract: The adrenal glands are unremarkable. There is no hydronephrosis or nephrolithiasis on either side. Multiple bilateral renal hypodense lesions measure up to 5 cm in the interpolar aspect of the right kidney. These lesions are suboptimally evaluated on this noncontrast CT  but demonstrate fluid attenuation most consistent with cysts. Ultrasound may provide better characterization on a nonemergent basis. The visualized ureters appear unremarkable. The urinary bladder is collapsed. There is apparent diffuse thickening of the bladder wall which may be partly related to underdistention. Cystitis is not excluded. Correlation with urinalysis recommended. Stomach/Bowel: There is sigmoid diverticulosis with muscular hypertrophy. No active inflammatory changes. There are scattered colonic diverticula without active inflammatory changes. There is no bowel obstruction. The appendix is normal. Vascular/Lymphatic: Advanced aortoiliac atherosclerotic disease. No portal venous gas. There is no adenopathy. Reproductive: Mildly enlarged prostate gland measuring 4.7 cm in transverse axial diameter. The seminal vesicles are symmetric. Other: None Musculoskeletal: Degenerative changes of the spine. No acute osseous pathology. IMPRESSION: 1. No acute intrathoracic pathology. 2. Inflammatory changes surrounding the gallbladder concerning for acute cholecystitis. Further evaluation with ultrasound is recommended. Noncalcified stones may be present within the gallbladder. 3. Apparent mild narrowing of the right middle lobe bronchus in the right hilum. This can be further evaluated with bronchoscopy on a nonemergent basis. 4. No hydronephrosis or nephrolithiasis. 5. Fatty liver. 6. Colonic diverticulosis. No bowel obstruction or active inflammation. Normal appendix. 7. Aortic Atherosclerosis (ICD10-I70.0). Electronically Signed   By: Anner Crete M.D.   On: 11/06/2018 20:32   Ct Head Wo Contrast  Result Date: 11/07/2018 CLINICAL  DATA:  Dizziness and encephalopathy EXAM: CT HEAD WITHOUT CONTRAST TECHNIQUE: Contiguous axial images were obtained from the base of the skull through the vertex without intravenous contrast. COMPARISON:  None. FINDINGS: Brain: There is no mass, hemorrhage or extra-axial collection. The appearance of the white matter is normal for the patient's age. There is generalized atrophy. 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 Vascular: No abnormal hyperdensity of the major intracranial arteries or dural venous sinuses. No intracranial atherosclerosis. Skull: The visualized skull base, calvarium and extracranial soft tissues are normal. Sinuses/Orbits: Mucosal thickening of the anterior left nasal cavity. No paranasal sinus fluid level or advanced mucosal thickening. No mastoid or middle ear effusion. Normal orbits. Other: None IMPRESSION: No acute intracranial abnormality. Electronically Signed   By: Ulyses Jarred M.D.   On: 11/07/2018 01:10   Ct Chest Wo Contrast  Result Date: 11/06/2018 CLINICAL DATA:  73 year old male with chest and back pain and weakness and shortness of breath. EXAM: CT CHEST, ABDOMEN AND PELVIS WITHOUT CONTRAST TECHNIQUE: Multidetector CT imaging of the chest, abdomen and pelvis was performed following the standard protocol without IV contrast. COMPARISON:  Radiograph dated 01/27/2005 and CT of the abdomen pelvis dated 01/26/2005 FINDINGS: Evaluation of this exam is limited in the absence of intravenous contrast. CT CHEST FINDINGS Cardiovascular: There is mild cardiomegaly. No significant pericardial effusion. Coronary vascular calcification primarily involving the LAD and left circumflex artery. There is atherosclerotic calcification of the aortic valve leaflets as well as moderate atherosclerotic calcification of the thoracic aorta. The central pulmonary arteries are grossly unremarkable. Mediastinum/Nodes: There is no hilar or mediastinal adenopathy. Several top-normal  mediastinal lymph nodes measure up to 11 mm in short axis. The esophagus is grossly unremarkable. No mediastinal fluid collection. Lungs/Pleura: The lungs are clear. There is no pleural effusion or pneumothorax. The central airways are patent. There is apparent narrowing of the right middle lobe bronchus in the right hilum of indeterminate etiology. Although this may be related to chronic inflammation. An endobronchial or a small hilar lesion is not entirely excluded. This can be better evaluated with bronchoscopy on a nonemergent basis. Musculoskeletal: There is degenerative changes of the  spine. No acute osseous pathology. CT ABDOMEN PELVIS FINDINGS No intra-abdominal free air or free fluid. Hepatobiliary: There is diffuse fatty infiltration of the liver. No intrahepatic biliary ductal dilatation. The gallbladder is partially contracted. Probable faint noncalcified stone in the gallbladder fundus. There is inflammatory changes surrounding the gallbladder. Findings concerning for acute cholecystitis. Further evaluation with ultrasound is recommended. Pancreas: Unremarkable. No pancreatic ductal dilatation or surrounding inflammatory changes. Spleen: Normal in size without focal abnormality. Adrenals/Urinary Tract: The adrenal glands are unremarkable. There is no hydronephrosis or nephrolithiasis on either side. Multiple bilateral renal hypodense lesions measure up to 5 cm in the interpolar aspect of the right kidney. These lesions are suboptimally evaluated on this noncontrast CT but demonstrate fluid attenuation most consistent with cysts. Ultrasound may provide better characterization on a nonemergent basis. The visualized ureters appear unremarkable. The urinary bladder is collapsed. There is apparent diffuse thickening of the bladder wall which may be partly related to underdistention. Cystitis is not excluded. Correlation with urinalysis recommended. Stomach/Bowel: There is sigmoid diverticulosis with muscular  hypertrophy. No active inflammatory changes. There are scattered colonic diverticula without active inflammatory changes. There is no bowel obstruction. The appendix is normal. Vascular/Lymphatic: Advanced aortoiliac atherosclerotic disease. No portal venous gas. There is no adenopathy. Reproductive: Mildly enlarged prostate gland measuring 4.7 cm in transverse axial diameter. The seminal vesicles are symmetric. Other: None Musculoskeletal: Degenerative changes of the spine. No acute osseous pathology. IMPRESSION: 1. No acute intrathoracic pathology. 2. Inflammatory changes surrounding the gallbladder concerning for acute cholecystitis. Further evaluation with ultrasound is recommended. Noncalcified stones may be present within the gallbladder. 3. Apparent mild narrowing of the right middle lobe bronchus in the right hilum. This can be further evaluated with bronchoscopy on a nonemergent basis. 4. No hydronephrosis or nephrolithiasis. 5. Fatty liver. 6. Colonic diverticulosis. No bowel obstruction or active inflammation. Normal appendix. 7. Aortic Atherosclerosis (ICD10-I70.0). Electronically Signed   By: Anner Crete M.D.   On: 11/06/2018 20:32   Dg Chest Port 1 View  Result Date: 11/06/2018 CLINICAL DATA:  Dizziness, weakness, shortness of breath. EXAM: PORTABLE CHEST 1 VIEW COMPARISON:  None. FINDINGS: Normal heart size with normal mediastinal contours. No focal airspace disease, pleural effusion or pneumothorax. No pulmonary edema. No acute osseous abnormalities. IMPRESSION: No acute chest finding. Electronically Signed   By: Keith Rake M.D.   On: 11/06/2018 19:56   US Abdomen Limited Ruq  Result Date: 11/06/2018 CLINICAL DATA:  Right upper quadrant pain EXAM: ULTRASOUND ABDOMEN LIMITED RIGHT UPPER QUADRANT COMPARISON:  None. FINDINGS: Gallbladder: There is diffuse gallbladder wall thickening with the gallbladder measuring approximately 1 cm in thickness. There are no gallstones. The  sonographic Percell Miller sign is negative. There is mild pericholecystic free fluid. Common bile duct: Diameter: 0.3 cm Liver: Diffuse increased echogenicity with slightly heterogeneous liver. Appearance typically secondary to fatty infiltration. Fibrosis secondary consideration. No secondary findings of cirrhosis noted. No focal hepatic lesion or intrahepatic biliary duct dilatation. Portal vein is patent on color Doppler imaging with normal direction of blood flow towards the liver. IMPRESSION: 1. Nonspecific gallbladder wall thickening in the absence of gallstones or a positive sonographic Murphy sign. 2. Hepatic steatosis. Electronically Signed   By: Constance Holster M.D.   On: 11/06/2018 21:44   Scheduled Meds: . carvedilol  12.5 mg Oral BID WC  . Chlorhexidine Gluconate Cloth  6 each Topical Daily  . ezetimibe  10 mg Oral Daily  . mouth rinse  15 mL Mouth Rinse BID   Continuous Infusions: .  sodium chloride Stopped (11/06/18 2147)  . sodium chloride 150 mL/hr at 11/07/18 0018  . piperacillin-tazobactam (ZOSYN)  IV Stopped (11/07/18 0727)    LOS: 1 day   Kerney Elbe, DO [facility] PAGER is on [place]  If 7PM-7AM, please contact night-coverage [email or web address] Password [email or web address] 11/07/2018, 7:30 AM

## 2018-11-08 ENCOUNTER — Inpatient Hospital Stay (HOSPITAL_COMMUNITY): Payer: Medicare Other

## 2018-11-08 LAB — CBC WITH DIFFERENTIAL/PLATELET
Abs Immature Granulocytes: 0.07 10*3/uL (ref 0.00–0.07)
Basophils Absolute: 0.1 10*3/uL (ref 0.0–0.1)
Basophils Relative: 1 %
Eosinophils Absolute: 0.1 10*3/uL (ref 0.0–0.5)
Eosinophils Relative: 1 %
HCT: 37.7 % — ABNORMAL LOW (ref 39.0–52.0)
Hemoglobin: 12 g/dL — ABNORMAL LOW (ref 13.0–17.0)
Immature Granulocytes: 1 %
Lymphocytes Relative: 9 %
Lymphs Abs: 0.7 10*3/uL (ref 0.7–4.0)
MCH: 33.1 pg (ref 26.0–34.0)
MCHC: 31.8 g/dL (ref 30.0–36.0)
MCV: 103.9 fL — ABNORMAL HIGH (ref 80.0–100.0)
Monocytes Absolute: 0.7 10*3/uL (ref 0.1–1.0)
Monocytes Relative: 9 %
Neutro Abs: 6.3 10*3/uL (ref 1.7–7.7)
Neutrophils Relative %: 79 %
Platelets: 64 10*3/uL — ABNORMAL LOW (ref 150–400)
RBC: 3.63 MIL/uL — ABNORMAL LOW (ref 4.22–5.81)
RDW: 15.2 % (ref 11.5–15.5)
WBC: 7.9 10*3/uL (ref 4.0–10.5)
nRBC: 0 % (ref 0.0–0.2)

## 2018-11-08 LAB — PHOSPHORUS: Phosphorus: 3.9 mg/dL (ref 2.5–4.6)

## 2018-11-08 LAB — IRON AND TIBC
Iron: 15 ug/dL — ABNORMAL LOW (ref 45–182)
Saturation Ratios: 7 % — ABNORMAL LOW (ref 17.9–39.5)
TIBC: 223 ug/dL — ABNORMAL LOW (ref 250–450)
UIBC: 208 ug/dL

## 2018-11-08 LAB — PROTEIN ELECTROPHORESIS, SERUM
A/G Ratio: 1.1 (ref 0.7–1.7)
Albumin ELP: 2.5 g/dL — ABNORMAL LOW (ref 2.9–4.4)
Alpha-1-Globulin: 0.3 g/dL (ref 0.0–0.4)
Alpha-2-Globulin: 0.7 g/dL (ref 0.4–1.0)
Beta Globulin: 0.6 g/dL — ABNORMAL LOW (ref 0.7–1.3)
Gamma Globulin: 0.6 g/dL (ref 0.4–1.8)
Globulin, Total: 2.3 g/dL (ref 2.2–3.9)
Total Protein ELP: 4.8 g/dL — ABNORMAL LOW (ref 6.0–8.5)

## 2018-11-08 LAB — PROTIME-INR
INR: 1.6 — ABNORMAL HIGH (ref 0.8–1.2)
Prothrombin Time: 18.5 seconds — ABNORMAL HIGH (ref 11.4–15.2)

## 2018-11-08 LAB — RETICULOCYTES
Immature Retic Fract: 13.2 % (ref 2.3–15.9)
RBC.: 3.63 MIL/uL — ABNORMAL LOW (ref 4.22–5.81)
Retic Count, Absolute: 41.7 10*3/uL (ref 19.0–186.0)
Retic Ct Pct: 1.2 % (ref 0.4–3.1)

## 2018-11-08 LAB — FERRITIN: Ferritin: 125 ng/mL (ref 24–336)

## 2018-11-08 LAB — MAGNESIUM: Magnesium: 2.1 mg/dL (ref 1.7–2.4)

## 2018-11-08 LAB — VITAMIN B12: Vitamin B-12: 1224 pg/mL — ABNORMAL HIGH (ref 180–914)

## 2018-11-08 LAB — FOLATE: Folate: 22.1 ng/mL (ref >5.9)

## 2018-11-08 MED ORDER — SODIUM CHLORIDE 0.9 % IV BOLUS
500.0000 mL | Freq: Once | INTRAVENOUS | Status: AC
  Administered 2018-11-08: 08:00:00 500 mL via INTRAVENOUS

## 2018-11-08 MED ORDER — SODIUM BICARBONATE 8.4 % IV SOLN
INTRAVENOUS | Status: DC
  Administered 2018-11-08 (×2): via INTRAVENOUS
  Filled 2018-11-08 (×3): qty 150

## 2018-11-08 MED ORDER — APIXABAN 5 MG PO TABS
5.0000 mg | ORAL_TABLET | Freq: Two times a day (BID) | ORAL | Status: DC
  Administered 2018-11-08 – 2018-11-10 (×5): 5 mg via ORAL
  Filled 2018-11-08 (×5): qty 1

## 2018-11-08 MED ORDER — SODIUM BICARBONATE 8.4 % IV SOLN
INTRAVENOUS | Status: DC
  Filled 2018-11-08: qty 150

## 2018-11-08 NOTE — Progress Notes (Signed)
Bladder scan 82 ml.  Pt with condom cath and emptying bladder well at this time.

## 2018-11-08 NOTE — Progress Notes (Signed)
Patient reported to this nurse that he has not always been able to catch urine in urinal for his  24 hour urine. Patient states that he has had multiple bowel movements mixed in with his urine.  Will place condom cath, education provided regarding the 24 hour urine and he states that he understands that  for next 24 hours we well obtain each urine at time of void.

## 2018-11-08 NOTE — Progress Notes (Signed)
PROGRESS NOTE    Jeffrey Hicks  ZOX:[id number] DOB: December 13, 1945 DOA: 11/06/2018 PCP: Curly Rim, MD  Brief Narrative: HPI per Dr. Jani Gravel on 11/06/2018   Jeffrey Hicks  is a 73 y.o. male, w hypertension, hyperlipidemia, Pafib, apparently c/o feeling lightheaded, and generally weak as well as sweats.  The sweats have been occurring since Thursday. The lightheadedness occurred yesterday.  Pt denies fever, cough, cp, palp, sob, n/v, abd pain, diarrhea, flank pain, dysuria, hematuria.  Pt does note slight increase in frequency of urination.  Pt notes that he takes OTC aleve.  He also has a few drinks at nite.   In ED,  T 98.1, P 51-102  R 26  Bp 82/58  Pox 97% on RA Wt 93kg  CT chest/ abd/ pelvis IMPRESSION: 1. No acute intrathoracic pathology. 2. Inflammatory changes surrounding the gallbladder concerning for acute cholecystitis. Further evaluation with ultrasound is recommended. Noncalcified stones may be present within the gallbladder. 3. Apparent mild narrowing of the right middle lobe bronchus in the right hilum. This can be further evaluated with bronchoscopy on a nonemergent basis. 4. No hydronephrosis or nephrolithiasis. 5. Fatty liver. 6. Colonic diverticulosis. No bowel obstruction or active inflammation. Normal appendix. 7. Aortic Atherosclerosis (ICD10-I70.0).  RUQ ultrasound IMPRESSION: 1. Nonspecific gallbladder wall thickening in the absence of gallstones or a positive sonographic Murphy sign. 2. Hepatic steatosis.  Na 133, K 3.5,  Bun 53, Creatinine 4.01 (previously 1.52 09/15/2016) Glucose 148 calcium 7.6 Alb 2.7,  Ast 23, Alt 19, Alk phos 92, T. Bili 1.1  Urinalysis prot 100,  Wbc 11-20, rbc 6-10 INR 2.8   Wbc 9.0, Hgb 11.3, mcv 101.7, Plt 57 Trop 29  => 26  Pt will be admitted for ARF, and sepsis (tachycardia, hypotension, elevated lactic acid, bandemia) secondary to UTI  **Interim History  She was eating breakfast again and was  wanting to go home but renal function has minimally improved we will continue IV fluid hydration and obtain renal ultrasound.  If renal function is not improving significantly will consult nephrology for further evaluation.  I discussed the case with Dr. Burnett Sheng of nephrology who recommends discontinuing his beta-blocker at this time and increasing IV fluid hydration given a bolus.  Likely the acute kidney injury is mediated likely by hypotension and will continue to monitor and continue IV fluid hydration and if is not significant improving will consult nephrology formally.  Assessment & Plan:   Principal Problem:   ARF (acute renal failure) (HCC) Active Problems:   Hyperlipidemia   Hypertension   PAF (paroxysmal atrial fibrillation) (HCC)   Hypotension   Acute lower UTI   Anemia   Thrombocytopenia (HCC)   Sepsis (HCC)  Acute Kidney Injury, slowly improving Metabolic Acidosis, slightly worsened  -Check urine sodium, urine creatinine, urine prot, urine eosinophils -Check spep, immunofixation -Check CPK -STOP Lisinopril -STOP Hydrochlorothiazide -STOP OTC Aleve -Hydrate with ns iv -Check renal ultrasound and showed "Bilateral exophytic renal cysts as described. No solid mass lesion or stone in either kidney. No obstruction.  Renal parenchyma is within normal limits for age." -Checking urine for light chains -Patient is BUN/creatinine went from 53/4.01 -> 56/4.01 -> 61/3.71 -Change IV fluids to sodium chloride with 150 mEq at a rate of 100 and mils per hour and give a 500 mL bolus -Continue monitor and trend renal function -Avoid nephrotoxic medications, contrast dyes and hypotension -The patient was hypotensive earlier and have discontinued his antihypertensives including his carvedilol -Repeat CMP in AM and  continue monitor renal function closely -If not improving significantly will formally consult nephrology in the a.m.  Hypotension likely secondary to sepsis -Check trop ,  cortisol -Echocardiogram as below -Stopped antihypertensives and discussed the case with nephrology Dr. Burnett Sheng who recommends holding his beta-blocker now Continue monitor and trend blood pressures carefully  Sepsis likely secondary to uti, present on Admission; improving -Blood culture x2 showed no growth to date at 1 day -States his biggest complaint last few days was fever and chills and then became very weak and almost passed out and fell backward on his couch due to weakness -Urinalysis showed cloudy appearance with amber color urine, trace leukocytes, rare bacteria, 11-20 WBCs, 6-10 RBCs, -Check urine culture and unfortunately this was never done when it was ordered and was obtained after patient was on antibiotics -F/u lactic acid -Continue with Zosyn iv pharmacy to dose -No Vanco for now due to impaired renal function -Continue to monitor and is afebrile and has no leukocytosis  Pafib -12 carvedilol due to hypotension and this was restarted but then held again -Resume his home Apixaban pharmacy to dose  Gout -Hold Allopurinol temporarily due to ARF -Hold Colchicine  Hyperlipidemia -Cont Zetia   Narrowing of the right middle lobe bronchus in the right hilum -This can be further evaluated with bronchoscopy on a nonemergent basis.  ETOH use -We will need to continue to monitor for signs of withdrawal  -continue to monitor closely and if necessary will place on a CIWA protocol  Obesity -Estimated body mass index is 32.23 kg/m as calculated from the following:   Height as of this encounter: 5' 10"  (1.778 m).   Weight as of this encounter: 101.9 kg. -Weight loss and Dietary Counseling given  Hyponatremia -Was Mild at 133. Improved to 135 and now dropped again to 134 -IV fluid hydration was changed to sodium bicarbonate with 150 mEq at a rate of 100 mL's per hour -Continue to Monitor and Trend and Repeat CMP in AM  Macrocytic Anemia -Patient's Hgb/Hct went from  11.3/35.8 -> 11.5/35.9 -> 12.0/27.7 -Checked Anemia Panel and showed an iron level of 15, U IBC of 2 8, TIBC of 223, saturation of 7%, ferritin level 125, folate level of 22.1, and vitamin B12 level of 1224 -Checked LDH and was 133 -Continue to Monitor for S/Sx of Bleeding; Currently no overt bleeding noted -Repeat CBC in AM   Thrombocytopenia -Patient's Platelet Count was 57 on Admission and this a.m. was 64 -Continue to Monitor for S/Sx of Bleeding now that anticoagulation is being resumed -Repeat CBC in AM   Diarrhea -We will need to continue monitor and likely is antibiotic mediated -Patient incidentally stated that he is having some diarrhea overnight but is improved -If continues to get worse will obtain a GI pathogen panel -Do not suspect C. difficile at this point  DVT prophylaxis: SCDs and resuming home apixaban Code Status: FULL CODE Family Communication: No family present at bedside  Disposition Plan: Remain in SDU for now for closer monitoring   Consultants:   None   Procedures:  ECHOCARDIOGRAM  IMPRESSIONS    1. The left ventricle has normal systolic function with an ejection fraction of 60-65%. The cavity size was normal. Left ventricular diastolic function could not be evaluated. Elevated mean left atrial pressure.  2. The right ventricle has normal systolic function. The cavity was normal.  3. The mitral valve is abnormal. There is mild mitral annular calcification present.  4. The aortic root is normal in  size and structure.  5. The tricuspid valve is grossly normal.  6. The aortic valve is tricuspid. Mild thickening of the aortic valve. No stenosis of the aortic valve.  7. The interatrial septum appears to be lipomatous.  8. Left atrial size was moderately dilated.  9. Right atrial size was mildly dilated. 10. The inferior vena cava was dilated in size with >50% respiratory variability. 11. Normal LV systolic function; proximal septal thickening; biatrial  enlargement; mild MR and TR; mildly elevated pulmonary pressure.  FINDINGS  Left Ventricle: The left ventricle has normal systolic function, with an ejection fraction of 60-65%. The cavity size was normal. There is no increase in left ventricular wall thickness. Left ventricular diastolic function could not be evaluated.  Elevated mean left atrial pressure  Right Ventricle: The right ventricle has normal systolic function. The cavity was normal.  Left Atrium: Left atrial size was moderately dilated.  Right Atrium: Right atrial size was mildly dilated. Right atrial pressure is estimated at 8 mmHg.  Interatrial Septum: No atrial level shunt detected by color flow Doppler. Increased thickness of the atrial septum sparing the fossa ovalis consistent with The interatrial septum appears to be lipomatous.  Pericardium: There is no evidence of pericardial effusion.  Mitral Valve: The mitral valve is abnormal. There is mild mitral annular calcification present. Mitral valve regurgitation is mild by color flow Doppler.  Tricuspid Valve: The tricuspid valve is grossly normal. Tricuspid valve regurgitation is mild by color flow Doppler.  Aortic Valve: The aortic valve is tricuspid Mild thickening of the aortic valve. Aortic valve regurgitation was not visualized by color flow Doppler. There is No stenosis of the aortic valve.  Pulmonic Valve: The pulmonic valve was not well visualized. Pulmonic valve regurgitation is not visualized by color flow Doppler.  Aorta: The aortic root is normal in size and structure.  Venous: The inferior vena cava is dilated in size with greater than 50% respiratory variability.  Additional Comments: Normal LV systolic function; proximal septal thickening; biatrial enlargement; mild MR and TR; mildly elevated pulmonary pressure.    +--------------+--------++ LEFT VENTRICLE         +----------------+---------++ +--------------+--------++ Diastology                 PLAX 2D                +----------------+---------++ +--------------+--------++ LV e' lateral:  8.19 cm/s LVIDd:        4.70 cm  +----------------+---------++ +--------------+--------++ LV E/e' lateral:16.5      LVIDs:        3.20 cm  +----------------+---------++ +--------------+--------++ LV e' medial:   6.08 cm/s LV PW:        1.10 cm  +----------------+---------++ +--------------+--------++ LV E/e' medial: 22.2      LV IVS:       1.20 cm  +----------------+---------++ +--------------+--------++ LVOT diam:    2.50 cm  +--------------+--------++ LV SV:        61 ml    +--------------+--------++ LV SV Index:  27.14    +--------------+--------++ LVOT Area:    4.91 cm +--------------+--------++                        +--------------+--------++  +---------------+---------++ RIGHT VENTRICLE          +---------------+---------++ RV Basal diam: 3.20 cm   +---------------+---------++ RV S prime:    5.35 cm/s +---------------+---------++ TAPSE (M-mode):2.8 cm    +---------------+---------++ RVSP:  38.3 mmHg +---------------+---------++  +---------------+--------++-----------++ LEFT ATRIUM            Index       +---------------+--------++-----------++ LA diam:       5.50 cm 2.52 cm/m  +---------------+--------++-----------++ LA Vol (A2C):  75.4 ml 34.53 ml/m +---------------+--------++-----------++ LA Vol (A4C):  105.0 ml48.08 ml/m +---------------+--------++-----------++ LA Biplane Vol:93.1 ml 42.63 ml/m +---------------+--------++-----------++ +------------+---------+++ RIGHT ATRIUM          +------------+---------+++ RA Pressure:8.00 mmHg +------------+---------+++  +------------+-----------++ AORTIC VALVE            +------------+-----------++ LVOT Vmax:  88.60 cm/s  +------------+-----------++ LVOT Vmean:  57.800 cm/s +------------+-----------++ LVOT VTI:   0.161 m     +------------+-----------++   +-------------+-------++ AORTA                +-------------+-------++ Ao Root diam:3.20 cm +-------------+-------++  +--------------+----------++  +---------------+-----------++ MITRAL VALVE              TRICUSPID VALVE            +--------------+----------++  +---------------+-----------++ MV Area (PHT):4.80 cm    TR Peak grad:  30.3 mmHg   +--------------+----------++  +---------------+-----------++ MV PHT:       45.82 msec  TR Vmax:       295.00 cm/s +--------------+----------++  +---------------+-----------++ MV Decel Time:158 msec    Estimated RAP: 8.00 mmHg   +--------------+----------++  +---------------+-----------++ +--------------+-----------++ RVSP:          38.3 mmHg   MV E velocity:135.00 cm/s +---------------+-----------++ +--------------+-----------++ MV A velocity:30.60 cm/s  +--------------+-------+ +--------------+-----------++ SHUNTS                MV E/A ratio: 4.41        +--------------+-------+ +--------------+-----------++ Systemic VTI: 0.16 m                                +--------------+-------+                               Systemic Diam:2.50 cm                               +--------------+-------+   Antimicrobials:  Anti-infectives (From admission, onward)   Start     Dose/Rate Route Frequency Ordered Stop   11/07/18 0600  piperacillin-tazobactam (ZOSYN) IVPB 2.25 g     2.25 g 100 mL/hr over 30 Minutes Intravenous Every 6 hours 11/07/18 0110     11/06/18 2145  piperacillin-tazobactam (ZOSYN) IVPB 3.375 g     3.375 g 100 mL/hr over 30 Minutes Intravenous  Once 11/06/18 2130 11/06/18 2302     Subjective: Seen and examined at bedside he states that he is feeling great and is wanting to go home.  Denies any chest pain, lightheadedness or dizziness.  No more chills and was  tolerating diet without issue.  Patient states that he is urinating with out issues as well.  No blood noted in his urine or bowel.  Discussed with him that his renal function was still off and he is agreeable to continue to get treated and he is stable for the medical floor so he is transferred.  Objective: Vitals:   11/08/18 1100 11/08/18 1200 11/08/18 1206 11/08/18 1258  BP: (!) 136/93   (!) 160/85  Pulse: 86 94  92  Resp: 17 18  16  Temp:   97.8 F (36.6 C) 98.2 F (36.8 C)  TempSrc:   Oral Oral  SpO2: 99% 98%  100%  Weight:    101.9 kg  Height:    5' 10"  (1.778 m)    Intake/Output Summary (Last 24 hours) at 11/08/2018 1936 Last data filed at 11/08/2018 1812 Gross per 24 hour  Intake 3844.36 ml  Output 1500 ml  Net 2344.36 ml   Filed Weights   11/06/18 1819 11/07/18 0128 11/08/18 1258  Weight: 93 kg 100.9 kg 101.9 kg   Examination: Physical Exam:  Constitutional: Well-nourished, well-developed obese Caucasian male currently no acute distress appears comfortable and eating breakfast at bedside Eyes: Lids and conjunctive are normal.  Sclera anicteric ENMT: External ears and nose appear normal.  Grossly normal hearing Neck: Appears supple no JVD Respiratory: Diminished auscultation bilaterally with no appreciable wheezing, rales, rhonchi.  Patient not tachypneic wheezing and accessory muscles to breathe Cardiovascular: Regular rate and rhythm.  No appreciable murmurs, rubs, gallops.  Has 1+ lower extremity edema still Abdomen: Soft, mildly tender, distended secondary body habitus.  Bowel sounds present GU: Deferred Musculoskeletal: No contractures or cyanosis.  No joint deformities in the upper and lower extremity Skin: Skin is warm and dry no appreciable rashes or lesions on to skin evaluation Neurologic: Cranial nerves II through XII grossly intact no appreciable focal deficits.  Romberg sign cerebellar reflexes were not assessed Psychiatric: Normal judgment and insight.   Patient is awake, alert, oriented x3.  Pleasant mood and affect  Data Reviewed: I have personally reviewed following labs and imaging studies  CBC: Recent Labs  Lab 11/06/18 1821 11/07/18 0234 11/08/18 0216  WBC 9.0 8.5 7.9  NEUTROABS 7.0  --  6.3  HGB 11.3* 11.5* 12.0*  HCT 35.8* 35.9* 37.7*  MCV 101.7* 101.7* 103.9*  PLT 57* 67* 64*   Basic Metabolic Panel: Recent Labs  Lab 11/06/18 1821 11/07/18 0234 11/07/18 2158 11/08/18 0216  NA 133* 135 134*  --   K 3.5 4.4 4.5  --   CL 99 104 106  --   CO2 20* 18* 15*  --   GLUCOSE 148* 121* 118*  --   BUN 53* 56* 61*  --   CREATININE 4.01* 4.01* 3.71*  --   CALCIUM 7.6* 7.3* 7.5*  --   MG  --   --   --  2.1  PHOS  --   --   --  3.9   GFR: Estimated Creatinine Clearance: 21.2 mL/min (A) (by C-G formula based on SCr of 3.71 mg/dL (H)). Liver Function Tests: Recent Labs  Lab 11/06/18 1821 11/07/18 0234 11/07/18 2158  AST 23 37 40  ALT 19 29 28   ALKPHOS 92 74 79  BILITOT 1.1 1.2 1.1  PROT 5.8* 5.7* 5.5*  ALBUMIN 2.7* 2.7* 2.4*   Recent Labs  Lab 11/06/18 1821  LIPASE 31   No results for input(s): AMMONIA in the last 168 hours. Coagulation Profile: Recent Labs  Lab 11/06/18 1833 11/08/18 0823  INR 2.8* 1.6*   Cardiac Enzymes: Recent Labs  Lab 11/07/18 0234  CKTOTAL 23*  CKMB 1.9   BNP (last 3 results) No results for input(s): PROBNP in the last 8760 hours. HbA1C: No results for input(s): HGBA1C in the last 72 hours. CBG: Recent Labs  Lab 11/06/18 1847  GLUCAP 134*   Lipid Profile: No results for input(s): CHOL, HDL, LDLCALC, TRIG, CHOLHDL, LDLDIRECT in the last 72 hours. Thyroid Function Tests: No results for  input(s): TSH, T4TOTAL, FREET4, T3FREE, THYROIDAB in the last 72 hours. Anemia Panel: Recent Labs    11/08/18 0216  VITAMINB12 1,224*  FOLATE 22.1  FERRITIN 125  TIBC 223*  IRON 15*  RETICCTPCT 1.2   Sepsis Labs: Recent Labs  Lab 11/06/18 2132 11/07/18 0234  LATICACIDVEN 2.1*  1.6    Recent Results (from the past 240 hour(s))  SARS Coronavirus 2 (CEPHEID - Performed in [facility] hospital lab), Hosp Order     Status: None   Collection Time: 11/06/18  6:49 PM   Specimen: Nasopharyngeal Swab  Result Value Ref Range Status   SARS Coronavirus 2 NEGATIVE NEGATIVE Final    Comment: (NOTE) If result is NEGATIVE SARS-CoV-2 target nucleic acids are NOT DETECTED. The SARS-CoV-2 RNA is generally detectable in upper and lower  respiratory specimens during the acute phase of infection. The lowest  concentration of SARS-CoV-2 viral copies this assay can detect is 250  copies / mL. A negative result does not preclude SARS-CoV-2 infection  and should not be used as the sole basis for treatment or other  patient management decisions.  A negative result may occur with  improper specimen collection / handling, submission of specimen other  than nasopharyngeal swab, presence of viral mutation(s) within the  areas targeted by this assay, and inadequate number of viral copies  (<250 copies / mL). A negative result must be combined with clinical  observations, patient history, and epidemiological information. If result is POSITIVE SARS-CoV-2 target nucleic acids are DETECTED. The SARS-CoV-2 RNA is generally detectable in upper and lower  respiratory specimens dur ing the acute phase of infection.  Positive  results are indicative of active infection with SARS-CoV-2.  Clinical  correlation with patient history and other diagnostic information is  necessary to determine patient infection status.  Positive results do  not rule out bacterial infection or co-infection with other viruses. If result is PRESUMPTIVE POSTIVE SARS-CoV-2 nucleic acids MAY BE PRESENT.   A presumptive positive result was obtained on the submitted specimen  and confirmed on repeat testing.  While 2019 novel coronavirus  (SARS-CoV-2) nucleic acids may be present in the submitted sample  additional  confirmatory testing may be necessary for epidemiological  and / or clinical management purposes  to differentiate between  SARS-CoV-2 and other Sarbecovirus currently known to infect humans.  If clinically indicated additional testing with an alternate test  methodology [phone]) is advised. The SARS-CoV-2 RNA is generally  detectable in upper and lower respiratory sp ecimens during the acute  phase of infection. The expected result is Negative. Fact Sheet for Patients:  StrictlyIdeas.no Fact Sheet for Healthcare Providers: BankingDealers.co.za This test is not yet approved or cleared by the [place] FDA and has been authorized for detection and/or diagnosis of SARS-CoV-2 by FDA under an Emergency Use Authorization (EUA).  This EUA will remain in effect (meaning this test can be used) for the duration of the COVID-19 declaration under Section 564(b)(1) of the Act, 21 U.S.C. section 360bbb-3(b)(1), unless the authorization is terminated or revoked sooner. Performed at [facility], [place] [address], [place], [place] [address]   Culture, blood (routine x 2)     Status: None (Preliminary result)   Collection Time: 11/06/18  9:32 PM   Specimen: BLOOD  Result Value Ref Range Status   Specimen Description   Final    BLOOD RIGHT ANTECUBITAL Performed at [facility] [address], [place],  [address]    Special Requests  Final    BOTTLES DRAWN AEROBIC AND ANAEROBIC Blood Culture adequate volume Performed at [facility] [address] [place], [place] [address]    Culture   Final    NO GROWTH 1 DAY Performed at [facility], [place] [address], [place], [place] [address]    Report Status PENDING  Incomplete  Culture, blood (routine x 2)     Status: None (Preliminary result)   Collection Time: 11/06/18  9:37 PM   Specimen: BLOOD  Result Value Ref Range Status    Specimen Description   Final    BLOOD LEFT ANTECUBITAL Performed at [facility] [address] [place], [place] [address]    Special Requests   Final    BOTTLES DRAWN AEROBIC AND ANAEROBIC Blood Culture adequate volume Performed at [facility] [address] [place], [place] [address]    Culture   Final    NO GROWTH 1 DAY Performed at [facility], [place] [address], [place], [place] [address]    Report Status PENDING  Incomplete  MRSA PCR Screening     Status: None   Collection Time: 11/07/18  1:21 AM   Specimen: Nasal Mucosa; Nasopharyngeal  Result Value Ref Range Status   MRSA by PCR NEGATIVE NEGATIVE Final    Comment:        The GeneXpert MRSA Assay (FDA approved for NASAL specimens only), is one component of a comprehensive MRSA colonization surveillance program. It is not intended to diagnose MRSA infection nor to guide or monitor treatment for MRSA infections. Performed at [facility], [place] [address] [place],  [address]     Radiology Studies: Ct Abdomen Pelvis Wo Contrast  Result Date: 11/06/2018 CLINICAL DATA:  73 year old male with chest and back pain and weakness and shortness of breath. EXAM: CT CHEST, ABDOMEN AND PELVIS WITHOUT CONTRAST TECHNIQUE: Multidetector CT imaging of the chest, abdomen and pelvis was performed following the standard protocol without IV contrast. COMPARISON:  Radiograph dated 01/27/2005 and CT of the abdomen pelvis dated 01/26/2005 FINDINGS: Evaluation of this exam is limited in the absence of intravenous contrast. CT CHEST FINDINGS Cardiovascular: There is mild cardiomegaly. No significant pericardial effusion. Coronary vascular calcification primarily involving the LAD and left circumflex artery. There is atherosclerotic calcification of the aortic valve leaflets as well as moderate atherosclerotic calcification of the thoracic aorta. The central pulmonary arteries are  grossly unremarkable. Mediastinum/Nodes: There is no hilar or mediastinal adenopathy. Several top-normal mediastinal lymph nodes measure up to 11 mm in short axis. The esophagus is grossly unremarkable. No mediastinal fluid collection. Lungs/Pleura: The lungs are clear. There is no pleural effusion or pneumothorax. The central airways are patent. There is apparent narrowing of the right middle lobe bronchus in the right hilum of indeterminate etiology. Although this may be related to chronic inflammation. An endobronchial or a small hilar lesion is not entirely excluded. This can be better evaluated with bronchoscopy on a nonemergent basis. Musculoskeletal: There is degenerative changes of the spine. No acute osseous pathology. CT ABDOMEN PELVIS FINDINGS No intra-abdominal free air or free fluid. Hepatobiliary: There is diffuse fatty infiltration of the liver. No intrahepatic biliary ductal dilatation. The gallbladder is partially contracted. Probable faint noncalcified stone in the gallbladder fundus. There is inflammatory changes surrounding the gallbladder. Findings concerning for acute cholecystitis. Further evaluation with ultrasound is recommended. Pancreas: Unremarkable. No pancreatic ductal dilatation or surrounding inflammatory changes. Spleen: Normal in size without focal abnormality.  Adrenals/Urinary Tract: The adrenal glands are unremarkable. There is no hydronephrosis or nephrolithiasis on either side. Multiple bilateral renal hypodense lesions measure up to 5 cm in the interpolar aspect of the right kidney. These lesions are suboptimally evaluated on this noncontrast CT but demonstrate fluid attenuation most consistent with cysts. Ultrasound may provide better characterization on a nonemergent basis. The visualized ureters appear unremarkable. The urinary bladder is collapsed. There is apparent diffuse thickening of the bladder wall which may be partly related to underdistention. Cystitis is not  excluded. Correlation with urinalysis recommended. Stomach/Bowel: There is sigmoid diverticulosis with muscular hypertrophy. No active inflammatory changes. There are scattered colonic diverticula without active inflammatory changes. There is no bowel obstruction. The appendix is normal. Vascular/Lymphatic: Advanced aortoiliac atherosclerotic disease. No portal venous gas. There is no adenopathy. Reproductive: Mildly enlarged prostate gland measuring 4.7 cm in transverse axial diameter. The seminal vesicles are symmetric. Other: None Musculoskeletal: Degenerative changes of the spine. No acute osseous pathology. IMPRESSION: 1. No acute intrathoracic pathology. 2. Inflammatory changes surrounding the gallbladder concerning for acute cholecystitis. Further evaluation with ultrasound is recommended. Noncalcified stones may be present within the gallbladder. 3. Apparent mild narrowing of the right middle lobe bronchus in the right hilum. This can be further evaluated with bronchoscopy on a nonemergent basis. 4. No hydronephrosis or nephrolithiasis. 5. Fatty liver. 6. Colonic diverticulosis. No bowel obstruction or active inflammation. Normal appendix. 7. Aortic Atherosclerosis (ICD10-I70.0). Electronically Signed   By: Anner Crete M.D.   On: 11/06/2018 20:32   Ct Head Wo Contrast  Result Date: 11/07/2018 CLINICAL DATA:  Dizziness and encephalopathy EXAM: CT HEAD WITHOUT CONTRAST TECHNIQUE: Contiguous axial images were obtained from the base of the skull through the vertex without intravenous contrast. COMPARISON:  None. FINDINGS: Brain: There is no mass, hemorrhage or extra-axial collection. The appearance of the white matter is normal for the patient's age. There is generalized atrophy. 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 Vascular: No abnormal hyperdensity of the major intracranial arteries or dural venous sinuses. No intracranial atherosclerosis. Skull: The visualized skull base,  calvarium and extracranial soft tissues are normal. Sinuses/Orbits: Mucosal thickening of the anterior left nasal cavity. No paranasal sinus fluid level or advanced mucosal thickening. No mastoid or middle ear effusion. Normal orbits. Other: None IMPRESSION: No acute intracranial abnormality. Electronically Signed   By: Ulyses Jarred M.D.   On: 11/07/2018 01:10   Ct Chest Wo Contrast  Result Date: 11/06/2018 CLINICAL DATA:  73 year old male with chest and back pain and weakness and shortness of breath. EXAM: CT CHEST, ABDOMEN AND PELVIS WITHOUT CONTRAST TECHNIQUE: Multidetector CT imaging of the chest, abdomen and pelvis was performed following the standard protocol without IV contrast. COMPARISON:  Radiograph dated 01/27/2005 and CT of the abdomen pelvis dated 01/26/2005 FINDINGS: Evaluation of this exam is limited in the absence of intravenous contrast. CT CHEST FINDINGS Cardiovascular: There is mild cardiomegaly. No significant pericardial effusion. Coronary vascular calcification primarily involving the LAD and left circumflex artery. There is atherosclerotic calcification of the aortic valve leaflets as well as moderate atherosclerotic calcification of the thoracic aorta. The central pulmonary arteries are grossly unremarkable. Mediastinum/Nodes: There is no hilar or mediastinal adenopathy. Several top-normal mediastinal lymph nodes measure up to 11 mm in short axis. The esophagus is grossly unremarkable. No mediastinal fluid collection. Lungs/Pleura: The lungs are clear. There is no pleural effusion or pneumothorax. The central airways are patent. There is apparent narrowing of the right middle lobe bronchus in  the right hilum of indeterminate etiology. Although this may be related to chronic inflammation. An endobronchial or a small hilar lesion is not entirely excluded. This can be better evaluated with bronchoscopy on a nonemergent basis. Musculoskeletal: There is degenerative changes of the spine. No  acute osseous pathology. CT ABDOMEN PELVIS FINDINGS No intra-abdominal free air or free fluid. Hepatobiliary: There is diffuse fatty infiltration of the liver. No intrahepatic biliary ductal dilatation. The gallbladder is partially contracted. Probable faint noncalcified stone in the gallbladder fundus. There is inflammatory changes surrounding the gallbladder. Findings concerning for acute cholecystitis. Further evaluation with ultrasound is recommended. Pancreas: Unremarkable. No pancreatic ductal dilatation or surrounding inflammatory changes. Spleen: Normal in size without focal abnormality. Adrenals/Urinary Tract: The adrenal glands are unremarkable. There is no hydronephrosis or nephrolithiasis on either side. Multiple bilateral renal hypodense lesions measure up to 5 cm in the interpolar aspect of the right kidney. These lesions are suboptimally evaluated on this noncontrast CT but demonstrate fluid attenuation most consistent with cysts. Ultrasound may provide better characterization on a nonemergent basis. The visualized ureters appear unremarkable. The urinary bladder is collapsed. There is apparent diffuse thickening of the bladder wall which may be partly related to underdistention. Cystitis is not excluded. Correlation with urinalysis recommended. Stomach/Bowel: There is sigmoid diverticulosis with muscular hypertrophy. No active inflammatory changes. There are scattered colonic diverticula without active inflammatory changes. There is no bowel obstruction. The appendix is normal. Vascular/Lymphatic: Advanced aortoiliac atherosclerotic disease. No portal venous gas. There is no adenopathy. Reproductive: Mildly enlarged prostate gland measuring 4.7 cm in transverse axial diameter. The seminal vesicles are symmetric. Other: None Musculoskeletal: Degenerative changes of the spine. No acute osseous pathology. IMPRESSION: 1. No acute intrathoracic pathology. 2. Inflammatory changes surrounding the  gallbladder concerning for acute cholecystitis. Further evaluation with ultrasound is recommended. Noncalcified stones may be present within the gallbladder. 3. Apparent mild narrowing of the right middle lobe bronchus in the right hilum. This can be further evaluated with bronchoscopy on a nonemergent basis. 4. No hydronephrosis or nephrolithiasis. 5. Fatty liver. 6. Colonic diverticulosis. No bowel obstruction or active inflammation. Normal appendix. 7. Aortic Atherosclerosis (ICD10-I70.0). Electronically Signed   By: Anner Crete M.D.   On: 11/06/2018 20:32   US Renal  Result Date: 11/07/2018 CLINICAL DATA:  ACUTE KIDNEY INSUFFICIENCY. EXAM: RENAL / URINARY TRACT ULTRASOUND COMPLETE COMPARISON:  CT OF THE ABDOMEN AND PELVIS 11/06/2018 FINDINGS: Right Kidney: Renal measurements: 11.3 X 6.6 X 6.3 = volume: 246.5 mL . Echogenicity within normal limits. To prominent exophytic cysts measure 5.5 X 5.2 X 4.7 CM and 5.1 X 4.6 X 3.7 CM respectively. No solid mass lesion is present. There is no hydronephrosis. No stones are present. Left Kidney: Renal measurements: 11.0 x 6.3 x 4.8 cm = volume: 175.8 mL. Echogenicity within normal limits. The single exophytic cyst at the upper pole of the left kidney measures 2.3 x 2.2 x 2.7 cm. No solid mass lesion is present. There is no hydronephrosis. No stones are present. Bladder: Appears normal for degree of bladder distention. Ureteral jets are evident within the urinary bladder bilaterally IMPRESSION: 1. Bilateral exophytic renal cysts as described. 2. No solid mass lesion or stone in either kidney. 3. No obstruction. 4. Renal parenchyma is within normal limits for age. Electronically Signed   By: San Morelle M.D.   On: 11/07/2018 10:45   Dg Chest Port 1 View  Result Date: 11/08/2018 CLINICAL DATA:  Shortness of breath EXAM: PORTABLE CHEST 1 VIEW COMPARISON:  11/06/2018  FINDINGS: Unchanged AP portable radiograph. Mild cardiomegaly without acute abnormality of  the lungs. No new airspace opacity. IMPRESSION: Unchanged AP portable radiograph. Mild cardiomegaly without acute abnormality of the lungs. No new airspace opacity. Electronically Signed   By: Eddie Candle M.D.   On: 11/08/2018 09:21   Dg Chest Port 1 View  Result Date: 11/06/2018 CLINICAL DATA:  Dizziness, weakness, shortness of breath. EXAM: PORTABLE CHEST 1 VIEW COMPARISON:  None. FINDINGS: Normal heart size with normal mediastinal contours. No focal airspace disease, pleural effusion or pneumothorax. No pulmonary edema. No acute osseous abnormalities. IMPRESSION: No acute chest finding. Electronically Signed   By: Keith Rake M.D.   On: 11/06/2018 19:56   US Abdomen Limited Ruq  Result Date: 11/06/2018 CLINICAL DATA:  Right upper quadrant pain EXAM: ULTRASOUND ABDOMEN LIMITED RIGHT UPPER QUADRANT COMPARISON:  None. FINDINGS: Gallbladder: There is diffuse gallbladder wall thickening with the gallbladder measuring approximately 1 cm in thickness. There are no gallstones. The sonographic Percell Miller sign is negative. There is mild pericholecystic free fluid. Common bile duct: Diameter: 0.3 cm Liver: Diffuse increased echogenicity with slightly heterogeneous liver. Appearance typically secondary to fatty infiltration. Fibrosis secondary consideration. No secondary findings of cirrhosis noted. No focal hepatic lesion or intrahepatic biliary duct dilatation. Portal vein is patent on color Doppler imaging with normal direction of blood flow towards the liver. IMPRESSION: 1. Nonspecific gallbladder wall thickening in the absence of gallstones or a positive sonographic Murphy sign. 2. Hepatic steatosis. Electronically Signed   By: Constance Holster M.D.   On: 11/06/2018 21:44   Scheduled Meds: . apixaban  5 mg Oral BID  . ezetimibe  10 mg Oral Daily  . mouth rinse  15 mL Mouth Rinse BID   Continuous Infusions: . piperacillin-tazobactam (ZOSYN)  IV 2.25 g (11/08/18 1413)  .  sodium bicarbonate  infusion  1000 mL 100 mL/hr at 11/08/18 1109    LOS: 2 days   Kerney Elbe, DO [facility] PAGER is on AMION  If 7PM-7AM, please contact night-coverage [email or web address] Password [facility] 11/08/2018, 7:36 PM

## 2018-11-09 ENCOUNTER — Other Ambulatory Visit: Payer: Self-pay | Admitting: Cardiovascular Disease

## 2018-11-09 LAB — CBC WITH DIFFERENTIAL/PLATELET
Abs Immature Granulocytes: 0.02 10*3/uL (ref 0.00–0.07)
Basophils Absolute: 0.1 10*3/uL (ref 0.0–0.1)
Basophils Relative: 1 %
Eosinophils Absolute: 0.1 10*3/uL (ref 0.0–0.5)
Eosinophils Relative: 2 %
HCT: 33.4 % — ABNORMAL LOW (ref 39.0–52.0)
Hemoglobin: 11.2 g/dL — ABNORMAL LOW (ref 13.0–17.0)
Immature Granulocytes: 0 %
Lymphocytes Relative: 16 %
Lymphs Abs: 0.9 10*3/uL (ref 0.7–4.0)
MCH: 32.8 pg (ref 26.0–34.0)
MCHC: 33.5 g/dL (ref 30.0–36.0)
MCV: 97.9 fL (ref 80.0–100.0)
Monocytes Absolute: 0.9 10*3/uL (ref 0.1–1.0)
Monocytes Relative: 15 %
Neutro Abs: 4 10*3/uL (ref 1.7–7.7)
Neutrophils Relative %: 66 %
Platelets: 103 10*3/uL — ABNORMAL LOW (ref 150–400)
RBC: 3.41 MIL/uL — ABNORMAL LOW (ref 4.22–5.81)
RDW: 15 % (ref 11.5–15.5)
WBC: 6 10*3/uL (ref 4.0–10.5)
nRBC: 0 % (ref 0.0–0.2)

## 2018-11-09 LAB — COMPREHENSIVE METABOLIC PANEL
ALT: 31 U/L (ref 0–44)
AST: 29 U/L (ref 15–41)
Albumin: 2.6 g/dL — ABNORMAL LOW (ref 3.5–5.0)
Alkaline Phosphatase: 108 U/L (ref 38–126)
Anion gap: 10 (ref 5–15)
BUN: 55 mg/dL — ABNORMAL HIGH (ref 8–23)
CO2: 23 mmol/L (ref 22–32)
Calcium: 8.3 mg/dL — ABNORMAL LOW (ref 8.9–10.3)
Chloride: 105 mmol/L (ref 98–111)
Creatinine, Ser: 2.71 mg/dL — ABNORMAL HIGH (ref 0.61–1.24)
GFR calc Af Amer: 26 mL/min — ABNORMAL LOW (ref >60)
GFR calc non Af Amer: 22 mL/min — ABNORMAL LOW (ref >60)
Glucose, Bld: 119 mg/dL — ABNORMAL HIGH (ref 70–99)
Potassium: 3.7 mmol/L (ref 3.5–5.1)
Sodium: 138 mmol/L (ref 135–145)
Total Bilirubin: 1.3 mg/dL — ABNORMAL HIGH (ref 0.3–1.2)
Total Protein: 6 g/dL — ABNORMAL LOW (ref 6.5–8.1)

## 2018-11-09 LAB — URINE CULTURE: Culture: NO GROWTH

## 2018-11-09 LAB — MAGNESIUM: Magnesium: 2.2 mg/dL (ref 1.7–2.4)

## 2018-11-09 LAB — PHOSPHORUS: Phosphorus: 3.8 mg/dL (ref 2.5–4.6)

## 2018-11-09 MED ORDER — PIPERACILLIN-TAZOBACTAM 3.375 G IVPB
3.3750 g | Freq: Three times a day (TID) | INTRAVENOUS | Status: DC
  Administered 2018-11-09 – 2018-11-10 (×3): 3.375 g via INTRAVENOUS
  Filled 2018-11-09 (×3): qty 50

## 2018-11-09 MED ORDER — HYDRALAZINE HCL 25 MG PO TABS
25.0000 mg | ORAL_TABLET | Freq: Three times a day (TID) | ORAL | Status: DC
  Administered 2018-11-09 – 2018-11-10 (×4): 25 mg via ORAL
  Filled 2018-11-09 (×4): qty 1

## 2018-11-09 MED ORDER — HYDRALAZINE HCL 20 MG/ML IJ SOLN
10.0000 mg | Freq: Four times a day (QID) | INTRAMUSCULAR | Status: DC | PRN
  Filled 2018-11-09: qty 1

## 2018-11-09 MED ORDER — SODIUM CHLORIDE 0.9 % IV SOLN
INTRAVENOUS | Status: DC
  Administered 2018-11-09: 11:00:00 via INTRAVENOUS

## 2018-11-09 NOTE — Progress Notes (Signed)
Pharmacy Antibiotic Note  Jeffrey Hicks is a 73 y.o. male admitted on 11/06/2018 with sepsis.  Pharmacy has been consulted for Zosyn dosing.  Plan: Zosyn changed back to 3.375gm q8, 4 hr infusion with improved renal function  Height: 5\' 10"  (177.8 cm) Weight: 223 lb 12.3 oz (101.5 kg) IBW/kg (Calculated) : 73  Temp (24hrs), Avg:97.9 F (36.6 C), Min:97.5 F (36.4 C), Max:98.2 F (36.8 C)  Recent Labs  Lab 11/06/18 1821 11/06/18 2132 11/07/18 0234 11/07/18 2158 11/08/18 0216 11/09/18 0533  WBC 9.0  --  8.5  --  7.9 6.0  CREATININE 4.01*  --  4.01* 3.71*  --  2.71*  LATICACIDVEN  --  2.1* 1.6  --   --   --     Estimated Creatinine Clearance: 29 mL/min (A) (by C-G formula based on SCr of 2.71 mg/dL (H)).    Allergies  Allergen Reactions  . Crestor  [Rosuvastatin Calcium] Other (See Comments)    Gout attack  . Lipitor  [Atorvastatin] Other (See Comments)    Gout Attack    Antimicrobials this admission: 7/19 Zosyn  >>   Dose adjustments this admission: 7/20 dosed at 2.26gm q6 7/22 change to 3.375gm q8, 4 hr infusion  Microbiology results: 7/21 UCx: ng-final 7/20 MRSA PCR: neg 7/19 bCx: ngtd  Thank you for allowing pharmacy to be a part of this patient's care.  Minda Ditto 11/09/2018 12:39 PM

## 2018-11-09 NOTE — Telephone Encounter (Signed)
Pt last saw Dr Acie Fredrickson 12/29/17, last labs 11/09/18 Creat 2.71, age 73, weight 94.4kg, based on specified criteria pt is on appropriate dosage of Eliquis 5mg  BID.  Will refill rx.

## 2018-11-09 NOTE — Progress Notes (Signed)
PROGRESS NOTE    Jeffrey Hicks  RUE:[id number] DOB: 10/23/1945 DOA: 11/06/2018 PCP: Vivien Prestoorrington, Kip A, MD   Brief Narrative: 73 y.o.male,w hypertension, hyperlipidemia, Pafib, apparently c/o feeling lightheaded, and generally weak as well as sweats.  He presented to the ER for evaluation where he was hypotensive 82/58, CT chest abdomen pelvis no acute finding no hydronephrosis, fatty liver and right upper quadrant ultrasound hepatic steatosis nonspecific gallbladder wall thickening was noted.  His creatinine was 4.1 baseline 1.5 in May/29/2018. Patient was admitted  Subjective: Resting well, no nausea vomiting chest pain fever chills. Tolerating diet  Assessment & Plan:   Acute kidney injury with metabolic acidosis: Likely due to hypotension/multiple medication: Lisinopril HCTZ/NSAID/diarrhea.  Has nicely improved off bicarb drip continue gentle normal saline overnight.  Blood pressure is stable, left extremity still remains on hold.  Improving nicely follow-up on nephrology consultation.  Case was discussed with Dr. Arlean HoppingSCHERTZ, not formally consulted.  SPEP with no evidence of monoclonal gammopathy , no M spike noted Recent Labs  Lab 11/06/18 1821 11/07/18 0234 11/07/18 2158 11/09/18 0533  BUN 53* 56* 61* 55*  CREATININE 4.01* 4.01* 3.71* 2.71*   Hypotension likely secondary to sepsis versus medication.  Off antihypertensive medication blood pressure is stable.  Sepsis likely due to UTI, unfortunately no urine culture obtained on admission, UA showed 11-20 WBC.  Patient has been on Zosyn and will continue for now.  Patient remains afebrile, no leukocytosis.  Blood culture no growth so far  Hyperlipidemia: Continue Zetia  Hyponatremia mild.  Stable  Hypertension: Blood pressure controlled, off beta-blocker.  PAF: Continue home Eliquis, Coreg on hold due to hypotension.  Rate is controlled.  Diarrhea monitor.  Stable.  Anemia: Hemoglobin remains stable Recent Labs  Lab  11/06/18 1821 11/07/18 0234 11/08/18 0216 11/09/18 0533  HGB 11.3* 11.5* 12.0* 11.2*  HCT 35.8* 35.9* 37.7* 33.4*    Thrombocytopenia : Platelet at 103 uptrending. Recent Labs  Lab 11/06/18 1821 11/07/18 0234 11/08/18 0216 11/09/18 0533  PLT 57* 67* 64* 103*    Narrowing of the right middle lobe bronchus in the right hilum, has been advised for outpatient follow-up.  Positive TROPONIN 26-29. NO DELTA CHANGE. on admission without chest pain.  Echo unremarkable.  Likely due to AKI  DVT prophylaxis: eliquis Code Status: full Family Communication: plan of care discussed with patient in detail. Disposition Plan: Remains inpatient pending clinical improvement.  Anticipate discharge next 24 hours if creat remains stable.   Consultants:   Procedures:  ECHO The left ventricle has normal systolic function with an ejection fraction of 60-65%. The cavity size was normal. Left ventricular diastolic function could not be evaluated. Elevated mean left atrial pressure. 2. The right ventricle has normal systolic function. The cavity was normal. 3. The mitral valve is abnormal. There is mild mitral annular calcification present. 4. The aortic root is normal in size and structure. 5. The tricuspid valve is grossly normal. 6. The aortic valve is tricuspid. Mild thickening of the aortic valve. No stenosis of the aortic valve. 7. The interatrial septum appears to be lipomatous. 8. Left atrial size was moderately dilated. 9. Right atrial size was mildly dilated. 10. The inferior vena cava was dilated in size with >50% respiratory variability. 11. Normal LV systolic function; proximal septal thickening; biatrial enlargement; mild MR and TR; mildly elevated pulmonary pressure.  FINDINGS Left Ventricle: The left ventricle has normal systolic function, with an ejection fraction of 60-65%. The cavity size was normal. There is no increase  in left ventricular wall thickness. Left  ventricular diastolic function could not be evaluated.  Elevated mean left atrial pressure  Right Ventricle: The right ventricle has normal systolic function. The cavity was normal.  Left Atrium: Left atrial size was moderately dilated.  Right Atrium: Right atrial size was mildly dilated. Right atrial pressure is estimated at 8 mmHg.  Interatrial Septum: No atrial level shunt detected by color flow Doppler. Increased thickness of the atrial septum sparing the fossa ovalis consistent with The interatrial septum appears to be lipomatous.  Pericardium: There is no evidence of pericardial effusion.  Mitral Valve: The mitral valve is abnormal. There is mild mitral annular calcification present. Mitral valve regurgitation is mild by color flow Doppler.  Tricuspid Valve: The tricuspid valve is grossly normal. Tricuspid valve regurgitation is mild by color flow Doppler.  Aortic Valve: The aortic valve is tricuspid Mild thickening of the aortic valve. Aortic valve regurgitation was not visualized by color flow Doppler. There is No stenosis of the aortic valve.  Pulmonic Valve: The pulmonic valve was not well visualized. Pulmonic valve regurgitation is not visualized by color flow Doppler.  Aorta: The aortic root is normal in size and structure.  Venous: The inferior vena cava is dilated in size with greater than 50% respiratory variability.  Additional Comments: Normal LV systolic function; proximal septal thickening; biatrial enlargement; mild MR and TR; mildly elevated pulmonary pressure.  Microbiology:  Antimicrobials: Anti-infectives (From admission, onward)   Start     Dose/Rate Route Frequency Ordered Stop   11/09/18 1600  piperacillin-tazobactam (ZOSYN) IVPB 3.375 g     3.375 g 12.5 mL/hr over 240 Minutes Intravenous Every 8 hours 11/09/18 1237     11/07/18 0600  piperacillin-tazobactam (ZOSYN) IVPB 2.25 g  Status:  Discontinued     2.25 g 100 mL/hr over 30 Minutes  Intravenous Every 6 hours 11/07/18 0110 11/09/18 1237   11/06/18 2145  piperacillin-tazobactam (ZOSYN) IVPB 3.375 g     3.375 g 100 mL/hr over 30 Minutes Intravenous  Once 11/06/18 2130 11/06/18 2302       Objective: Vitals:   11/08/18 1258 11/08/18 2056 11/09/18 0512 11/09/18 0600  BP: (!) 160/85 (!) 157/85 (!) 162/94   Pulse: 92 77 90   Resp: 16 19 20    Temp: 98.2 F (36.8 C) 97.9 F (36.6 C) (!) 97.5 F (36.4 C)   TempSrc: Oral Oral Oral   SpO2: 100% 99% 97%   Weight: 101.9 kg   101.5 kg  Height: 5\' 10"  (1.778 m)       Intake/Output Summary (Last 24 hours) at 11/09/2018 1443 Last data filed at 11/09/2018 0600 Gross per 24 hour  Intake 2009.94 ml  Output 2275 ml  Net -265.06 ml   Filed Weights   11/07/18 0128 11/08/18 1258 11/09/18 0600  Weight: 100.9 kg 101.9 kg 101.5 kg   Weight change:   Body mass index is 32.11 kg/m.  Intake/Output from previous day: 07/21 0701 - 07/22 0700 In: 2593.2 [P.O.:290; I.V.:1703.2; IV Piggyback:600] Out: 2575 [Urine:2575] Intake/Output this shift: No intake/output data recorded.  Examination:  General exam: Appears calm and comfortable,Not in distress, older fore the age HEENT:PERRL,Oral mucosa moist, Ear/Nose normal on gross exam Respiratory system: Bilateral equal air entry, normal vesicular breath sounds, no wheezes or crackles  Cardiovascular system: S1 & S2 heard,No JVD, murmurs. Gastrointestinal system: Abdomen is  soft, non tender, non distended, BS +  Nervous System:Alert and oriented. No focal neurological deficits/moving extremities, sensation intact. Extremities: No edema,  no clubbing, distal peripheral pulses palpable. Skin: No rashes, lesions, no icterus MSK: Normal muscle bulk,tone ,power  Medications:  Scheduled Meds: . apixaban  5 mg Oral BID  . ezetimibe  10 mg Oral Daily  . mouth rinse  15 mL Mouth Rinse BID   Continuous Infusions: . sodium chloride 100 mL/hr at 11/09/18 1044  . piperacillin-tazobactam  (ZOSYN)  IV      Data Reviewed: I have personally reviewed following labs and imaging studies  CBC: Recent Labs  Lab 11/06/18 1821 11/07/18 0234 11/08/18 0216 11/09/18 0533  WBC 9.0 8.5 7.9 6.0  NEUTROABS 7.0  --  6.3 4.0  HGB 11.3* 11.5* 12.0* 11.2*  HCT 35.8* 35.9* 37.7* 33.4*  MCV 101.7* 101.7* 103.9* 97.9  PLT 57* 67* 64* 103*   Basic Metabolic Panel: Recent Labs  Lab 11/06/18 1821 11/07/18 0234 11/07/18 2158 11/08/18 0216 11/09/18 0533  NA 133* 135 134*  --  138  K 3.5 4.4 4.5  --  3.7  CL 99 104 106  --  105  CO2 20* 18* 15*  --  23  GLUCOSE 148* 121* 118*  --  119*  BUN 53* 56* 61*  --  55*  CREATININE 4.01* 4.01* 3.71*  --  2.71*  CALCIUM 7.6* 7.3* 7.5*  --  8.3*  MG  --   --   --  2.1 2.2  PHOS  --   --   --  3.9 3.8   GFR: Estimated Creatinine Clearance: 29 mL/min (A) (by C-G formula based on SCr of 2.71 mg/dL (H)). Liver Function Tests: Recent Labs  Lab 11/06/18 1821 11/07/18 0234 11/07/18 2158 11/09/18 0533  AST 23 37 40 29  ALT ALKPHOS 92 74 79 108  BILITOT 1.1 1.2 1.1 1.3*  PROT 5.8* 5.7* 5.5* 6.0*  ALBUMIN 2.7* 2.7* 2.4* 2.6*   Recent Labs  Lab 11/06/18 1821  LIPASE 31   No results for input(s): AMMONIA in the last 168 hours. Coagulation Profile: Recent Labs  Lab 11/06/18 1833 11/08/18 0823  INR 2.8* 1.6*   Cardiac Enzymes: Recent Labs  Lab 11/07/18 0234  CKTOTAL 23*  CKMB 1.9   BNP (last 3 results) No results for input(s): PROBNP in the last 8760 hours. HbA1C: No results for input(s): HGBA1C in the last 72 hours. CBG: Recent Labs  Lab 11/06/18 1847  GLUCAP 134*   Lipid Profile: No results for input(s): CHOL, HDL, LDLCALC, TRIG, CHOLHDL, LDLDIRECT in the last 72 hours. Thyroid Function Tests: No results for input(s): TSH, T4TOTAL, FREET4, T3FREE, THYROIDAB in the last 72 hours. Anemia Panel: Recent Labs    11/08/18 0216  VITAMINB12 1,224*  FOLATE 22.1  FERRITIN 125  TIBC 223*  IRON 15*   RETICCTPCT 1.2   Sepsis Labs: Recent Labs  Lab 11/06/18 2132 11/07/18 0234  LATICACIDVEN 2.1* 1.6    Recent Results (from the past 240 hour(s))  SARS Coronavirus 2 (CEPHEID - Performed in [facility] hospital lab), Hosp Order     Status: None   Collection Time: 11/06/18  6:49 PM   Specimen: Nasopharyngeal Swab  Result Value Ref Range Status   SARS Coronavirus 2 NEGATIVE NEGATIVE Final    Comment: (NOTE) If result is NEGATIVE SARS-CoV-2 target nucleic acids are NOT DETECTED. The SARS-CoV-2 RNA is generally detectable in upper and lower  respiratory specimens during the acute phase of infection. The lowest  concentration of SARS-CoV-2 viral copies this assay can detect is 250  copies /  mL. A negative result does not preclude SARS-CoV-2 infection  and should not be used as the sole basis for treatment or other  patient management decisions.  A negative result may occur with  improper specimen collection / handling, submission of specimen other  than nasopharyngeal swab, presence of viral mutation(s) within the  areas targeted by this assay, and inadequate number of viral copies  (<250 copies / mL). A negative result must be combined with clinical  observations, patient history, and epidemiological information. If result is POSITIVE SARS-CoV-2 target nucleic acids are DETECTED. The SARS-CoV-2 RNA is generally detectable in upper and lower  respiratory specimens dur ing the acute phase of infection.  Positive  results are indicative of active infection with SARS-CoV-2.  Clinical  correlation with patient history and other diagnostic information is  necessary to determine patient infection status.  Positive results do  not rule out bacterial infection or co-infection with other viruses. If result is PRESUMPTIVE POSTIVE SARS-CoV-2 nucleic acids MAY BE PRESENT.   A presumptive positive result was obtained on the submitted specimen  and confirmed on repeat testing.  While 2019  novel coronavirus  (SARS-CoV-2) nucleic acids may be present in the submitted sample  additional confirmatory testing may be necessary for epidemiological  and / or clinical management purposes  to differentiate between  SARS-CoV-2 and other Sarbecovirus currently known to infect humans.  If clinically indicated additional testing with an alternate test  methodology [phone]) is advised. The SARS-CoV-2 RNA is generally  detectable in upper and lower respiratory sp ecimens during the acute  phase of infection. The expected result is Negative. Fact Sheet for Patients:  StrictlyIdeas.no Fact Sheet for Healthcare Providers: BankingDealers.co.za This test is not yet approved or cleared by the [place] FDA and has been authorized for detection and/or diagnosis of SARS-CoV-2 by FDA under an Emergency Use Authorization (EUA).  This EUA will remain in effect (meaning this test can be used) for the duration of the COVID-19 declaration under Section 564(b)(1) of the Act, 21 U.S.C. section 360bbb-3(b)(1), unless the authorization is terminated or revoked sooner. Performed at [facility], [place] [address] [place], [place] [address]   Culture, blood (routine x 2)     Status: None (Preliminary result)   Collection Time: 11/06/18  9:32 PM   Specimen: BLOOD  Result Value Ref Range Status   Specimen Description   Final    BLOOD RIGHT ANTECUBITAL Performed at [facility] [address], [place], [place] [address]    Special Requests   Final    BOTTLES DRAWN AEROBIC AND ANAEROBIC Blood Culture adequate volume Performed at [facility] [address] [place], [place] [address]    Culture   Final    NO GROWTH 2 DAYS Performed at [facility] [address] [place], [place] [address]    Report Status PENDING  Incomplete  Culture, blood (routine x 2)     Status: None (Preliminary  result)   Collection Time: 11/06/18  9:37 PM   Specimen: BLOOD  Result Value Ref Range Status   Specimen Description   Final    BLOOD LEFT ANTECUBITAL Performed at [facility] [address] [place], [place] [address]    Special Requests   Final    BOTTLES DRAWN AEROBIC AND ANAEROBIC Blood Culture adequate volume Performed at [facility] [address], [place],  [address]    Culture   Final  NO GROWTH 2 DAYS Performed at [facility], [address] [place], [place] [address]    Report Status PENDING  Incomplete  MRSA PCR Screening     Status: None   Collection Time: 11/07/18  1:21 AM   Specimen: Nasal Mucosa; Nasopharyngeal  Result Value Ref Range Status   MRSA by PCR NEGATIVE NEGATIVE Final    Comment:        The GeneXpert MRSA Assay (FDA approved for NASAL specimens only), is one component of a comprehensive MRSA colonization surveillance program. It is not intended to diagnose MRSA infection nor to guide or monitor treatment for MRSA infections. Performed at [facility], [address] [place], [place] [address]   Culture, Urine     Status: None   Collection Time: 11/08/18  7:45 AM   Specimen: Urine, Random  Result Value Ref Range Status   Specimen Description   Final    URINE, RANDOM Performed at [facility], [address] [place], [place] [address]    Special Requests   Final    NONE Performed at [facility], [address] [place], [place] [address]    Culture   Final    NO GROWTH Performed at [facility], [address] [place], [place] [address]    Report Status 11/09/2018 FINAL  Final      Radiology Studies: Dg Chest Port 1 View  Result Date: 11/08/2018 CLINICAL DATA:  Shortness of breath EXAM: PORTABLE CHEST 1 VIEW COMPARISON:  11/06/2018 FINDINGS: Unchanged AP portable radiograph. Mild cardiomegaly without acute  abnormality of the lungs. No new airspace opacity. IMPRESSION: Unchanged AP portable radiograph. Mild cardiomegaly without acute abnormality of the lungs. No new airspace opacity. Electronically Signed   By: Lauralyn PrimesAlex  Bibbey M.D.   On: 11/08/2018 09:21      LOS: 3 days   Time spent: More than 50% of that time was spent in counseling and/or coordination of care.  Lanae Boastamesh Jeanita Carneiro, MD [facility]  11/09/2018, 2:43 PM

## 2018-11-09 NOTE — Plan of Care (Signed)

## 2018-11-10 LAB — BASIC METABOLIC PANEL
Anion gap: 10 (ref 5–15)
BUN: 40 mg/dL — ABNORMAL HIGH (ref 8–23)
CO2: 26 mmol/L (ref 22–32)
Calcium: 8.6 mg/dL — ABNORMAL LOW (ref 8.9–10.3)
Chloride: 103 mmol/L (ref 98–111)
Creatinine, Ser: 2.06 mg/dL — ABNORMAL HIGH (ref 0.61–1.24)
GFR calc Af Amer: 36 mL/min — ABNORMAL LOW (ref >60)
GFR calc non Af Amer: 31 mL/min — ABNORMAL LOW (ref >60)
Glucose, Bld: 99 mg/dL (ref 70–99)
Potassium: 4.5 mmol/L (ref 3.5–5.1)
Sodium: 139 mmol/L (ref 135–145)

## 2018-11-10 LAB — CBC
HCT: 36.3 % — ABNORMAL LOW (ref 39.0–52.0)
Hemoglobin: 11.7 g/dL — ABNORMAL LOW (ref 13.0–17.0)
MCH: 32.5 pg (ref 26.0–34.0)
MCHC: 32.2 g/dL (ref 30.0–36.0)
MCV: 100.8 fL — ABNORMAL HIGH (ref 80.0–100.0)
Platelets: 122 10*3/uL — ABNORMAL LOW (ref 150–400)
RBC: 3.6 MIL/uL — ABNORMAL LOW (ref 4.22–5.81)
RDW: 15 % (ref 11.5–15.5)
WBC: 8.5 10*3/uL (ref 4.0–10.5)
nRBC: 0 % (ref 0.0–0.2)

## 2018-11-10 MED ORDER — HYDRALAZINE HCL 25 MG PO TABS: 25.0000 mg | ORAL_TABLET | Freq: Three times a day (TID) | ORAL | 0 refills | Status: DC

## 2018-11-10 NOTE — Progress Notes (Signed)
AVS given to patient and explained at the bedside. Medications and follow up appointments have been explained with pt verbalizing understanding.  

## 2018-11-10 NOTE — Care Management Important Message (Signed)
Important Message  Patient Details IM Letter given to Sharren Bridge SW to present to the Patient Name: Jeffrey Hicks MRN: [id number] Date of Birth: 12-27-1945   Medicare Important Message Given:  Yes     Kerin Salen 11/10/2018, 11:37 AM

## 2018-11-10 NOTE — Discharge Summary (Addendum)
Physician Discharge Summary  OPAL MCKELLIPS VOH:[id number] DOB: [birth year] DOA: 11/06/2018  PCP: Curly Rim, MD  Admit date: 11/06/2018 Discharge date: 11/10/2018  Admitted From: home Disposition:  home  Recommendations for Outpatient Follow-up:  1. Follow up with PCP in 1-2 weeks 2. Please obtain BMP/ in 5 days 3. Please follow up on the following pending results:  Home Health:no  Equipment/Devices: none  Discharge Condition: Stable CODE STATUS: FULL Diet recommendation: Heart Healthy  Brief/Interim Summary:  73 y.o.male,w hypertension, hyperlipidemia, Pafib, apparently c/o feeling lightheaded, and generally weak as well as sweats.  He presented to the ER for evaluation where he was hypotensive 82/58, CT chest abdomen pelvis no acute finding no hydronephrosis, fatty liver and right upper quadrant ultrasound hepatic steatosis nonspecific gallbladder wall thickening was noted.  His creatinine was 4.1 baseline 1.5 in May/29/2018. Patient was admitted. Patient was treated with IV fluids antihypertensives were held. His blood pressure stabilized and creatinine functions continue to improve on daily basis.  Rate and peak to 4.0 and improved to 2.0 on the day of discharge. He is being discharged with instruction to hold his antihypertensive except hydralazine (new), and will be following up with PCP to check his renal function in 4 to 5 days.  Patient verbalized understanding.  Subjective: Resting well, no nausea vomiting chest pain fever chills. Tolerating diet. Requesting to go home today.  Assessment & Plan/issues addressed during hospitalization:   Acute kidney injury with metabolic acidosis: Likely due to hypotension/multiple medication: Lisinopril HCTZ/NSAID/diarrhea.  Has nicely improved off bicarb drip, and off saline infusion.  Creatinine improved 2.0.  Patient desires to go home today. Blood pressure is stable, HCTZ/lisinopril remains on hold.  Case was discussed  with Dr. Jonnie Finner with by Dr Georgina Quint.SPEP with no evidence of monoclonal gammopathy , no M spike noted.  Patient was discharged home advised to hydrate, advised NSAIDs and follow-up with PCP to repeat BMP in 4 5 days Recent Labs  Lab 11/06/18 1821 11/07/18 0234 11/07/18 2158 11/09/18 0533 11/10/18 0552  BUN 53* 56* 61* 55* 40*  CREATININE 4.01* 4.01* 3.71* 2.71* 2.06*   Hypotension likely secondary to sepsis versus medication.  Off antihypertensive medication blood pressure is stable,  And on higher side so he was placed on hydralazine.  He will follow-up with PCP to monitor and adjust his antihypertensive.  Sepsis likely due to UTI, unfortunately no urine culture obtained on admission, UA showed 11-20 WBC.  Could have been sepsis versus hypotension likely from antihypertensive.  Nevertheless treated with Zosyn since 7/19 until day of discharge 7/23, patient has completed antibiotic course and no need for further antibiotics. remains afebrile, no leukocytosis.Blood culture no growth so far.  He is to contact his MD return to ER if he has fever or any other signs of infection nausea vomiting.  Hyperlipidemia: Continue Zetia  Hyponatremia mild.  Stable  Hypertension: on higher side so he was placed on hydralazine.  He will follow-up with PCP to monitor and adjust his antihypertensive.  PAF: Continue home Eliquis, Coreg on hold due to hypotension.  Rate is controlled.  Diarrhea monitor.  Stable.  Anemia: Hemoglobin remains stable Recent Labs  Lab 11/06/18 1821 11/07/18 0234 11/08/18 0216 11/09/18 0533 11/10/18 0552  HGB 11.3* 11.5* 12.0* 11.2* 11.7*  HCT 35.8* 35.9* 37.7* 33.4* 36.3*    Thrombocytopenia : Platelet at 122.  Likely due to his renal failure acute sickness. Recent Labs  Lab 11/06/18 1821 11/07/18 0234 11/08/18 0216 11/09/18 0533 11/10/18 0552  PLT  57* 67* 64* 103* 122*   Narrowing of the right middle lobe bronchus in the right hilum, has been advised for  outpatient follow-up.  Positive TROPONIN 26-29. NO DELTA CHANGE. on admission without chest pain.  Echo unremarkable.  Likely due to AKI  Addendum: At 4 pm. Called by microbiology lab that patient's blood culture was positive.  Patient was growing haemophilus influenza beta-lactamase negative from 7/19 in 1 out of 4 bottles. Patient did not have any leukocytosis or fever. I discussed with Dr. Luciana Axe ID physician on call-discussed about the case suspecting likely contamination/transient infection, but patient has been appropriately treated,No need to continue on antibiotics. ID has no further recommendation. I did call him-as he was discharged home, and spoke to him, he was at house. He was feeling well I discussed with him about the finding and we spoke about being watchful and watch for any kind of signs and symptoms of infection in which case he needs to seek medical attention from his doctor or return to the ER.  Discharge Diagnoses:  Principal Problem:   ARF (acute renal failure) (HCC) Active Problems:   Hyperlipidemia   Hypertension   PAF (paroxysmal atrial fibrillation) (HCC)   Hypotension   Acute lower UTI   Anemia   Thrombocytopenia (HCC)   Sepsis (HCC)    Discharge Instructions  Discharge Instructions    Diet - low sodium heart healthy   Complete by: As directed    Discharge instructions   Complete by: As directed    Please call call MD or return to ER for similar or worsening recurring problem that brought you to hospital or if any fever,nausea/vomiting,abdominal pain, uncontrolled pain, chest pain,  shortness of breath or any other alarming symptoms.  Please follow-up your doctor for BMP/Renal function blood test in 5 days.  Avoid NSAIDs like Ibuprofen/motrin due to kidney problem.  Please avoid alcohol, smoking, or any other illicit substance and maintain healthy habits including taking your regular medications as prescribed.  You were cared for by a hospitalist  during your hospital stay. If you have any questions about your discharge medications or the care you received while you were in the hospital after you are discharged, you can call the unit and ask to speak with the hospitalist on call if the hospitalist that took care of you is not available.  Once you are discharged, your primary care physician will handle any further medical issues. Please note that NO REFILLS for any discharge medications will be authorized once you are discharged, as it is imperative that you return to your primary care physician (or establish a relationship with a primary care physician if you do not have one) for your aftercare needs so that they can reassess your need for medications and monitor your lab values   Increase activity slowly   Complete by: As directed      Allergies as of 11/10/2018      Reactions   Crestor  [rosuvastatin Calcium] Other (See Comments)   Gout attack   Lipitor  [atorvastatin] Other (See Comments)   Gout Attack      Medication List    STOP taking these medications   carvedilol 12.5 MG tablet Commonly known as: COREG   hydrochlorothiazide 12.5 MG tablet Commonly known as: HYDRODIURIL   lisinopril 10 MG tablet Commonly known as: ZESTRIL     TAKE these medications   acetaminophen 650 MG CR tablet Commonly known as: TYLENOL Take 650 mg by mouth every 8 (eight)  hours as needed for pain.   allopurinol 100 MG tablet Commonly known as: ZYLOPRIM Take 100 mg by mouth daily.   colchicine 0.6 MG tablet Take 2 tablets by mouth See admin instructions. Take 2 tablets by mouth at first sign of gout and may repeat dose in one hour   Eliquis 5 MG Tabs tablet Generic drug: apixaban TAKE ONE TABLET BY MOUTH TWICE DAILY What changed: how much to take   ezetimibe 10 MG tablet Commonly known as: ZETIA Take 10 mg by mouth daily.   FISH OIL PO Take 1,000 mg by mouth daily.   hydrALAZINE 25 MG tablet Commonly known as: APRESOLINE Take 1  tablet (25 mg total) by mouth every 8 (eight) hours.   multivitamin tablet Take 1 tablet by mouth daily.      Follow-up Information    Corrington, Kip A, MD Follow up in 5 day(s).   Specialty: Family Medicine Why: BMP check in 5 days Contact information: [address] [place] [address] [phone]        [name], [name], MD .   Specialty: Cardiology Contact information: [address] [place] [address] [phone]          Allergies  Allergen Reactions  . Crestor  [Rosuvastatin Calcium] Other (See Comments)    Gout attack  . Lipitor  [Atorvastatin] Other (See Comments)    Gout Attack    Procedures/Studies: Ct Abdomen Pelvis Wo Contrast  Result Date: 11/06/2018 CLINICAL DATA:  73 year old male with chest and back pain and weakness and shortness of breath. EXAM: CT CHEST, ABDOMEN AND PELVIS WITHOUT CONTRAST TECHNIQUE: Multidetector CT imaging of the chest, abdomen and pelvis was performed following the standard protocol without IV contrast. COMPARISON:  Radiograph dated 01/27/2005 and CT of the abdomen pelvis dated 01/26/2005 FINDINGS: Evaluation of this exam is limited in the absence of intravenous contrast. CT CHEST FINDINGS Cardiovascular: There is mild cardiomegaly. No significant pericardial effusion. Coronary vascular calcification primarily involving the LAD and left circumflex artery. There is atherosclerotic calcification of the aortic valve leaflets as well as moderate atherosclerotic calcification of the thoracic aorta. The central pulmonary arteries are grossly unremarkable. Mediastinum/Nodes: There is no hilar or mediastinal adenopathy. Several top-normal mediastinal lymph nodes measure up to 11 mm in short axis. The esophagus is grossly unremarkable. No mediastinal fluid collection. Lungs/Pleura: The lungs are clear. There is no pleural effusion or pneumothorax. The central airways are patent. There is apparent narrowing of the  right middle lobe bronchus in the right hilum of indeterminate etiology. Although this may be related to chronic inflammation. An endobronchial or a small hilar lesion is not entirely excluded. This can be better evaluated with bronchoscopy on a nonemergent basis. Musculoskeletal: There is degenerative changes of the spine. No acute osseous pathology. CT ABDOMEN PELVIS FINDINGS No intra-abdominal free air or free fluid. Hepatobiliary: There is diffuse fatty infiltration of the liver. No intrahepatic biliary ductal dilatation. The gallbladder is partially contracted. Probable faint noncalcified stone in the gallbladder fundus. There is inflammatory changes surrounding the gallbladder. Findings concerning for acute cholecystitis. Further evaluation with ultrasound is recommended. Pancreas: Unremarkable. No pancreatic ductal dilatation or surrounding inflammatory changes. Spleen: Normal in size without focal abnormality. Adrenals/Urinary Tract: The adrenal glands are unremarkable. There is no hydronephrosis or nephrolithiasis on either side. Multiple bilateral renal hypodense lesions measure up to 5 cm in the interpolar aspect of the right kidney. These lesions are suboptimally evaluated on this noncontrast CT but demonstrate fluid  attenuation most consistent with cysts. Ultrasound may provide better characterization on a nonemergent basis. The visualized ureters appear unremarkable. The urinary bladder is collapsed. There is apparent diffuse thickening of the bladder wall which may be partly related to underdistention. Cystitis is not excluded. Correlation with urinalysis recommended. Stomach/Bowel: There is sigmoid diverticulosis with muscular hypertrophy. No active inflammatory changes. There are scattered colonic diverticula without active inflammatory changes. There is no bowel obstruction. The appendix is normal. Vascular/Lymphatic: Advanced aortoiliac atherosclerotic disease. No portal venous gas. There is no  adenopathy. Reproductive: Mildly enlarged prostate gland measuring 4.7 cm in transverse axial diameter. The seminal vesicles are symmetric. Other: None Musculoskeletal: Degenerative changes of the spine. No acute osseous pathology. IMPRESSION: 1. No acute intrathoracic pathology. 2. Inflammatory changes surrounding the gallbladder concerning for acute cholecystitis. Further evaluation with ultrasound is recommended. Noncalcified stones may be present within the gallbladder. 3. Apparent mild narrowing of the right middle lobe bronchus in the right hilum. This can be further evaluated with bronchoscopy on a nonemergent basis. 4. No hydronephrosis or nephrolithiasis. 5. Fatty liver. 6. Colonic diverticulosis. No bowel obstruction or active inflammation. Normal appendix. 7. Aortic Atherosclerosis (ICD10-I70.0). Electronically Signed   By: Elgie Collard M.D.   On: 11/06/2018 20:32   Ct Head Wo Contrast  Result Date: 11/07/2018 CLINICAL DATA:  Dizziness and encephalopathy EXAM: CT HEAD WITHOUT CONTRAST TECHNIQUE: Contiguous axial images were obtained from the base of the skull through the vertex without intravenous contrast. COMPARISON:  None. FINDINGS: Brain: There is no mass, hemorrhage or extra-axial collection. The appearance of the white matter is normal for the patient's age. There is generalized atrophy. 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 Vascular: No abnormal hyperdensity of the major intracranial arteries or dural venous sinuses. No intracranial atherosclerosis. Skull: The visualized skull base, calvarium and extracranial soft tissues are normal. Sinuses/Orbits: Mucosal thickening of the anterior left nasal cavity. No paranasal sinus fluid level or advanced mucosal thickening. No mastoid or middle ear effusion. Normal orbits. Other: None IMPRESSION: No acute intracranial abnormality. Electronically Signed   By: Deatra Robinson M.D.   On: 11/07/2018 01:10   Ct Chest Wo  Contrast  Result Date: 11/06/2018 CLINICAL DATA:  73 year old male with chest and back pain and weakness and shortness of breath. EXAM: CT CHEST, ABDOMEN AND PELVIS WITHOUT CONTRAST TECHNIQUE: Multidetector CT imaging of the chest, abdomen and pelvis was performed following the standard protocol without IV contrast. COMPARISON:  Radiograph dated 01/27/2005 and CT of the abdomen pelvis dated 01/26/2005 FINDINGS: Evaluation of this exam is limited in the absence of intravenous contrast. CT CHEST FINDINGS Cardiovascular: There is mild cardiomegaly. No significant pericardial effusion. Coronary vascular calcification primarily involving the LAD and left circumflex artery. There is atherosclerotic calcification of the aortic valve leaflets as well as moderate atherosclerotic calcification of the thoracic aorta. The central pulmonary arteries are grossly unremarkable. Mediastinum/Nodes: There is no hilar or mediastinal adenopathy. Several top-normal mediastinal lymph nodes measure up to 11 mm in short axis. The esophagus is grossly unremarkable. No mediastinal fluid collection. Lungs/Pleura: The lungs are clear. There is no pleural effusion or pneumothorax. The central airways are patent. There is apparent narrowing of the right middle lobe bronchus in the right hilum of indeterminate etiology. Although this may be related to chronic inflammation. An endobronchial or a small hilar lesion is not entirely excluded. This can be better evaluated with bronchoscopy on a nonemergent basis. Musculoskeletal: There is degenerative changes of the spine. No acute  osseous pathology. CT ABDOMEN PELVIS FINDINGS No intra-abdominal free air or free fluid. Hepatobiliary: There is diffuse fatty infiltration of the liver. No intrahepatic biliary ductal dilatation. The gallbladder is partially contracted. Probable faint noncalcified stone in the gallbladder fundus. There is inflammatory changes surrounding the gallbladder. Findings  concerning for acute cholecystitis. Further evaluation with ultrasound is recommended. Pancreas: Unremarkable. No pancreatic ductal dilatation or surrounding inflammatory changes. Spleen: Normal in size without focal abnormality. Adrenals/Urinary Tract: The adrenal glands are unremarkable. There is no hydronephrosis or nephrolithiasis on either side. Multiple bilateral renal hypodense lesions measure up to 5 cm in the interpolar aspect of the right kidney. These lesions are suboptimally evaluated on this noncontrast CT but demonstrate fluid attenuation most consistent with cysts. Ultrasound may provide better characterization on a nonemergent basis. The visualized ureters appear unremarkable. The urinary bladder is collapsed. There is apparent diffuse thickening of the bladder wall which may be partly related to underdistention. Cystitis is not excluded. Correlation with urinalysis recommended. Stomach/Bowel: There is sigmoid diverticulosis with muscular hypertrophy. No active inflammatory changes. There are scattered colonic diverticula without active inflammatory changes. There is no bowel obstruction. The appendix is normal. Vascular/Lymphatic: Advanced aortoiliac atherosclerotic disease. No portal venous gas. There is no adenopathy. Reproductive: Mildly enlarged prostate gland measuring 4.7 cm in transverse axial diameter. The seminal vesicles are symmetric. Other: None Musculoskeletal: Degenerative changes of the spine. No acute osseous pathology. IMPRESSION: 1. No acute intrathoracic pathology. 2. Inflammatory changes surrounding the gallbladder concerning for acute cholecystitis. Further evaluation with ultrasound is recommended. Noncalcified stones may be present within the gallbladder. 3. Apparent mild narrowing of the right middle lobe bronchus in the right hilum. This can be further evaluated with bronchoscopy on a nonemergent basis. 4. No hydronephrosis or nephrolithiasis. 5. Fatty liver. 6. Colonic  diverticulosis. No bowel obstruction or active inflammation. Normal appendix. 7. Aortic Atherosclerosis (ICD10-I70.0). Electronically Signed   By: Elgie CollardArash  Radparvar M.D.   On: 11/06/2018 20:32   [place] Renal  Result Date: 11/07/2018 CLINICAL DATA:  ACUTE KIDNEY INSUFFICIENCY. EXAM: RENAL / URINARY TRACT ULTRASOUND COMPLETE COMPARISON:  CT OF THE ABDOMEN AND PELVIS 11/06/2018 FINDINGS: Right Kidney: Renal measurements: 11.3 X 6.6 X 6.3 = volume: 246.5 mL . Echogenicity within normal limits. To prominent exophytic cysts measure 5.5 X 5.2 X 4.7 CM and 5.1 X 4.6 X 3.7 CM respectively. No solid mass lesion is present. There is no hydronephrosis. No stones are present. Left Kidney: Renal measurements: 11.0 x 6.3 x 4.8 cm = volume: 175.8 mL. Echogenicity within normal limits. The single exophytic cyst at the upper pole of the left kidney measures 2.3 x 2.2 x 2.7 cm. No solid mass lesion is present. There is no hydronephrosis. No stones are present. Bladder: Appears normal for degree of bladder distention. Ureteral jets are evident within the urinary bladder bilaterally IMPRESSION: 1. Bilateral exophytic renal cysts as described. 2. No solid mass lesion or stone in either kidney. 3. No obstruction. 4. Renal parenchyma is within normal limits for age. Electronically Signed   By: Marin Robertshristopher  Mattern M.D.   On: 11/07/2018 10:45   Dg Chest Port 1 View  Result Date: 11/08/2018 CLINICAL DATA:  Shortness of breath EXAM: PORTABLE CHEST 1 VIEW COMPARISON:  11/06/2018 FINDINGS: Unchanged AP portable radiograph. Mild cardiomegaly without acute abnormality of the lungs. No new airspace opacity. IMPRESSION: Unchanged AP portable radiograph. Mild cardiomegaly without acute abnormality of the lungs. No new airspace opacity. Electronically Signed   By: Lauralyn PrimesAlex  Bibbey M.D.   On:  11/08/2018 09:21   Dg Chest Port 1 View  Result Date: 11/06/2018 CLINICAL DATA:  Dizziness, weakness, shortness of breath. EXAM: PORTABLE CHEST 1 VIEW  COMPARISON:  None. FINDINGS: Normal heart size with normal mediastinal contours. No focal airspace disease, pleural effusion or pneumothorax. No pulmonary edema. No acute osseous abnormalities. IMPRESSION: No acute chest finding. Electronically Signed   By: Narda RutherfordMelanie  Sanford M.D.   On: 11/06/2018 19:56   [place] Abdomen Limited Ruq  Result Date: 11/06/2018 CLINICAL DATA:  Right upper quadrant pain EXAM: ULTRASOUND ABDOMEN LIMITED RIGHT UPPER QUADRANT COMPARISON:  None. FINDINGS: Gallbladder: There is diffuse gallbladder wall thickening with the gallbladder measuring approximately 1 cm in thickness. There are no gallstones. The sonographic Eulah PontMurphy sign is negative. There is mild pericholecystic free fluid. Common bile duct: Diameter: 0.3 cm Liver: Diffuse increased echogenicity with slightly heterogeneous liver. Appearance typically secondary to fatty infiltration. Fibrosis secondary consideration. No secondary findings of cirrhosis noted. No focal hepatic lesion or intrahepatic biliary duct dilatation. Portal vein is patent on color Doppler imaging with normal direction of blood flow towards the liver. IMPRESSION: 1. Nonspecific gallbladder wall thickening in the absence of gallstones or a positive sonographic Murphy sign. 2. Hepatic steatosis. Electronically Signed   By: Katherine Mantlehristopher  Green M.D.   On: 11/06/2018 21:44   Subjective: Resting well, peeing well and no nausea, vomiting and abdominal pain.Wants to go home today.  Discharge Exam: Vitals:   11/09/18 1937 11/10/18 0453  BP: (!) 180/94 (!) 162/91  Pulse: 84 91  Resp: 18 16  Temp: 98.2 F (36.8 C) 98 F (36.7 C)  SpO2: 97% 96%   Vitals:   11/09/18 1450 11/09/18 1937 11/10/18 0453 11/10/18 0500  BP: (!) 185/99 (!) 180/94 (!) 162/91   Pulse: 91 84 91   Resp: 16 18 16    Temp: (!) 97.4 F (36.3 C) 98.2 F (36.8 C) 98 F (36.7 C)   TempSrc: Oral Oral Oral   SpO2: 97% 97% 96%   Weight:    100.7 kg  Height:        General: Pt is alert,  awake, not in acute distress Cardiovascular: RRR, S1/S2 +, no rubs, no gallops Respiratory: CTA bilaterally, no wheezing, no rhonchi Abdominal: Soft, NT, ND, bowel sounds + Extremities: no edema, no cyanosis   The results of significant diagnostics from this hospitalization (including imaging, microbiology, ancillary and laboratory) are listed below for reference.     Microbiology: Recent Results (from the past 240 hour(s))  SARS Coronavirus 2 (CEPHEID - Performed in [facility] hospital lab), Hosp Order     Status: None   Collection Time: 11/06/18  6:49 PM   Specimen: Nasopharyngeal Swab  Result Value Ref Range Status   SARS Coronavirus 2 NEGATIVE NEGATIVE Final    Comment: (NOTE) If result is NEGATIVE SARS-CoV-2 target nucleic acids are NOT DETECTED. The SARS-CoV-2 RNA is generally detectable in upper and lower  respiratory specimens during the acute phase of infection. The lowest  concentration of SARS-CoV-2 viral copies this assay can detect is 250  copies / mL. A negative result does not preclude SARS-CoV-2 infection  and should not be used as the sole basis for treatment or other  patient management decisions.  A negative result may occur with  improper specimen collection / handling, submission of specimen other  than nasopharyngeal swab, presence of viral mutation(s) within the  areas targeted by this assay, and inadequate number of viral copies  (<250 copies / mL). A negative result must be  combined with clinical  observations, patient history, and epidemiological information. If result is POSITIVE SARS-CoV-2 target nucleic acids are DETECTED. The SARS-CoV-2 RNA is generally detectable in upper and lower  respiratory specimens dur ing the acute phase of infection.  Positive  results are indicative of active infection with SARS-CoV-2.  Clinical  correlation with patient history and other diagnostic information is  necessary to determine patient infection status.   Positive results do  not rule out bacterial infection or co-infection with other viruses. If result is PRESUMPTIVE POSTIVE SARS-CoV-2 nucleic acids MAY BE PRESENT.   A presumptive positive result was obtained on the submitted specimen  and confirmed on repeat testing.  While 2019 novel coronavirus  (SARS-CoV-2) nucleic acids may be present in the submitted sample  additional confirmatory testing may be necessary for epidemiological  and / or clinical management purposes  to differentiate between  SARS-CoV-2 and other Sarbecovirus currently known to infect humans.  If clinically indicated additional testing with an alternate test  methodology [phone]) is advised. The SARS-CoV-2 RNA is generally  detectable in upper and lower respiratory sp ecimens during the acute  phase of infection. The expected result is Negative. Fact Sheet for Patients:  BoilerBrush.com.cy Fact Sheet for Healthcare Providers: https://pope.com/ This test is not yet approved or cleared by the [place] FDA and has been authorized for detection and/or diagnosis of SARS-CoV-2 by FDA under an Emergency Use Authorization (EUA).  This EUA will remain in effect (meaning this test can be used) for the duration of the COVID-19 declaration under Section 564(b)(1) of the Act, 21 U.S.C. section 360bbb-3(b)(1), unless the authorization is terminated or revoked sooner. Performed at [facility], [address] [place], [place] [address]   Culture, blood (routine x 2)     Status: None (Preliminary result)   Collection Time: 11/06/18  9:32 PM   Specimen: BLOOD  Result Value Ref Range Status   Specimen Description   Final    BLOOD RIGHT ANTECUBITAL Performed at [facility], [address] [place], [place] [address]    Special Requests   Final    BOTTLES DRAWN AEROBIC AND ANAEROBIC Blood Culture adequate volume Performed at [facility], [address] [place], [place] [address]    Culture   Final    NO GROWTH 3 DAYS Performed at [facility], [address] [place], [place] [address]    Report Status PENDING  Incomplete  Culture, blood (routine x 2)     Status: None (Preliminary result)   Collection Time: 11/06/18  9:37 PM   Specimen: BLOOD  Result Value Ref Range Status   Specimen Description   Final    BLOOD LEFT ANTECUBITAL Performed at [facility], [address] [place], [place] [address]    Special Requests   Final    BOTTLES DRAWN AEROBIC AND ANAEROBIC Blood Culture adequate volume Performed at [facility], [address] [place], [place] [address]    Culture   Final    NO GROWTH 3 DAYS Performed at [facility], [address] [place], [place] [address]    Report Status PENDING  Incomplete  MRSA PCR Screening     Status: None   Collection Time: 11/07/18  1:21 AM   Specimen: Nasal Mucosa; Nasopharyngeal  Result Value Ref Range Status   MRSA by PCR NEGATIVE NEGATIVE Final    Comment:        The  GeneXpert MRSA Assay (FDA approved for NASAL specimens only), is one component of a comprehensive MRSA colonization surveillance program. It is not intended to diagnose MRSA infection nor to guide or monitor treatment for MRSA infections. Performed at [facility], [address] [place], [place] [address]   Culture, Urine     Status: None   Collection Time: 11/08/18  7:45 AM   Specimen: Urine, Random  Result Value Ref Range Status   Specimen Description   Final    URINE, RANDOM Performed at [facility], [address], [place], [place] [address]    Special Requests   Final    NONE Performed at [facility], [address], [place], [place] [address]    Culture   Final    NO GROWTH Performed at [facility], [address] [place], [place] [address]     Report Status 11/09/2018 FINAL  Final     Labs: BNP (last 3 results) No results for input(s): BNP in the last 8760 hours. Basic Metabolic Panel: Recent Labs  Lab 11/06/18 1821 11/07/18 0234 11/07/18 2158 11/08/18 0216 11/09/18 0533 11/10/18 0552  NA 133* 135 134*  --  138 139  K 3.5 4.4 4.5  --  3.7 4.5  CL 99 104 106  --  105 103  CO2 20* 18* 15*  --  23 26  GLUCOSE 148* 121* 118*  --  119* 99  BUN 53* 56* 61*  --  55* 40*  CREATININE 4.01* 4.01* 3.71*  --  2.71* 2.06*  CALCIUM 7.6* 7.3* 7.5*  --  8.3* 8.6*  MG  --   --   --  2.1 2.2  --   PHOS  --   --   --  3.9 3.8  --    Liver Function Tests: Recent Labs  Lab 11/06/18 1821 11/07/18 0234 11/07/18 2158 11/09/18 0533  AST 23 37 40 29  ALT ALKPHOS 92 74 79 108  BILITOT 1.1 1.2 1.1 1.3*  PROT 5.8* 5.7* 5.5* 6.0*  ALBUMIN 2.7* 2.7* 2.4* 2.6*   Recent Labs  Lab 11/06/18 1821  LIPASE 31   No results for input(s): AMMONIA in the last 168 hours. CBC: Recent Labs  Lab 11/06/18 1821 11/07/18 0234 11/08/18 0216 11/09/18 0533 11/10/18 0552  WBC 9.0 8.5 7.9 6.0 8.5  NEUTROABS 7.0  --  6.3 4.0  --   HGB 11.3* 11.5* 12.0* 11.2* 11.7*  HCT 35.8* 35.9* 37.7* 33.4* 36.3*  MCV 101.7* 101.7* 103.9* 97.9 100.8*  PLT 57* 67* 64* 103* 122*   Cardiac Enzymes: Recent Labs  Lab 11/07/18 0234  CKTOTAL 23*  CKMB 1.9   BNP: Invalid input(s): POCBNP CBG: Recent Labs  Lab 11/06/18 1847  GLUCAP 134*   D-Dimer No results for input(s): DDIMER in the last 72 hours. Hgb A1c No results for input(s): HGBA1C in the last 72 hours. Lipid Profile No results for input(s): CHOL, HDL, LDLCALC, TRIG, CHOLHDL, LDLDIRECT in the last 72 hours. Thyroid function studies No results for input(s): TSH, T4TOTAL, T3FREE, THYROIDAB in the last 72 hours.  Invalid input(s): FREET3 Anemia work up Recent Labs    11/08/18 0216  VITAMINB12 1,224*  FOLATE 22.1  FERRITIN 125  TIBC 223*  IRON 15*  RETICCTPCT 1.2    Urinalysis    Component Value Date/Time   COLORURINE AMBER (A) 11/06/2018 1822   APPEARANCEUR CLOUDY (A) 11/06/2018 1822   LABSPEC 1.027  11/06/2018 1822   PHURINE 5.0 11/06/2018 1822   GLUCOSEU NEGATIVE 11/06/2018 1822   HGBUR NEGATIVE 11/06/2018 1822   BILIRUBINUR NEGATIVE 11/06/2018 1822   KETONESUR NEGATIVE 11/06/2018 1822   PROTEINUR 100 (A) 11/06/2018 1822   NITRITE NEGATIVE 11/06/2018 1822   LEUKOCYTESUR TRACE (A) 11/06/2018 1822   Sepsis Labs Invalid input(s): PROCALCITONIN,  WBC,  LACTICIDVEN Microbiology Recent Results (from the past 240 hour(s))  SARS Coronavirus 2 (CEPHEID - Performed in [facility] hospital lab), Hosp Order     Status: None   Collection Time: 11/06/18  6:49 PM   Specimen: Nasopharyngeal Swab  Result Value Ref Range Status   SARS Coronavirus 2 NEGATIVE NEGATIVE Final    Comment: (NOTE) If result is NEGATIVE SARS-CoV-2 target nucleic acids are NOT DETECTED. The SARS-CoV-2 RNA is generally detectable in upper and lower  respiratory specimens during the acute phase of infection. The lowest  concentration of SARS-CoV-2 viral copies this assay can detect is 250  copies / mL. A negative result does not preclude SARS-CoV-2 infection  and should not be used as the sole basis for treatment or other  patient management decisions.  A negative result may occur with  improper specimen collection / handling, submission of specimen other  than nasopharyngeal swab, presence of viral mutation(s) within the  areas targeted by this assay, and inadequate number of viral copies  (<250 copies / mL). A negative result must be combined with clinical  observations, patient history, and epidemiological information. If result is POSITIVE SARS-CoV-2 target nucleic acids are DETECTED. The SARS-CoV-2 RNA is generally detectable in upper and lower  respiratory specimens dur ing the acute phase of infection.  Positive  results are indicative of active infection with  SARS-CoV-2.  Clinical  correlation with patient history and other diagnostic information is  necessary to determine patient infection status.  Positive results do  not rule out bacterial infection or co-infection with other viruses. If result is PRESUMPTIVE POSTIVE SARS-CoV-2 nucleic acids MAY BE PRESENT.   A presumptive positive result was obtained on the submitted specimen  and confirmed on repeat testing.  While 2019 novel coronavirus  (SARS-CoV-2) nucleic acids may be present in the submitted sample  additional confirmatory testing may be necessary for epidemiological  and / or clinical management purposes  to differentiate between  SARS-CoV-2 and other Sarbecovirus currently known to infect humans.  If clinically indicated additional testing with an alternate test  methodology [phone]([id number]) is advised. The SARS-CoV-2 RNA is generally  detectable in upper and lower respiratory sp ecimens during the acute  phase of infection. The expected result is Negative. Fact Sheet for Patients:  BoilerBrush.com.cyhttps://www.fda.gov/media/136312/download Fact Sheet for Healthcare Providers: https://pope.com/https://www.fda.gov/media/136313/download This test is not yet approved or cleared by the [place] FDA and has been authorized for detection and/or diagnosis of SARS-CoV-2 by FDA under an Emergency Use Authorization (EUA).  This EUA will remain in effect (meaning this test can be used) for the duration of the COVID-19 declaration under Section 564(b)(1) of the Act, 21 U.S.C. section 360bbb-3(b)(1), unless the authorization is terminated or revoked sooner. Performed at [facility], [address] [place], [place] [id number]   Culture, blood (routine x 2)     Status: None (Preliminary result)   Collection Time: 11/06/18  9:32 PM   Specimen: BLOOD  Result Value Ref Range Status   Specimen Description   Final    BLOOD RIGHT ANTECUBITAL Performed at [facility], [address]  [place],  [place] [address]    Special Requests   Final    BOTTLES DRAWN AEROBIC AND ANAEROBIC Blood Culture adequate volume Performed at [facility], [address] [place], [place] [address]    Culture   Final    NO GROWTH 3 DAYS Performed at [facility], [address], [place], [place] [address]    Report Status PENDING  Incomplete  Culture, blood (routine x 2)     Status: None (Preliminary result)   Collection Time: 11/06/18  9:37 PM   Specimen: BLOOD  Result Value Ref Range Status   Specimen Description   Final    BLOOD LEFT ANTECUBITAL Performed at [facility], [address], [place], [place] [address]    Special Requests   Final    BOTTLES DRAWN AEROBIC AND ANAEROBIC Blood Culture adequate volume Performed at [facility], [address] [place], [place] [address]    Culture   Final    NO GROWTH 3 DAYS Performed at [facility], [address], [place], [place] [address]    Report Status PENDING  Incomplete  MRSA PCR Screening     Status: None   Collection Time: 11/07/18  1:21 AM   Specimen: Nasal Mucosa; Nasopharyngeal  Result Value Ref Range Status   MRSA by PCR NEGATIVE NEGATIVE Final    Comment:        The GeneXpert MRSA Assay (FDA approved for NASAL specimens only), is one component of a comprehensive MRSA colonization surveillance program. It is not intended to diagnose MRSA infection nor to guide or monitor treatment for MRSA infections. Performed at [facility], [address] [place], [place] [address]   Culture, Urine     Status: None   Collection Time: 11/08/18  7:45 AM   Specimen: Urine, Random  Result Value Ref Range Status   Specimen Description   Final    URINE, RANDOM Performed at [facility], [address], [place], [place] [address]    Special Requests   Final    NONE Performed at [facility], [address] [place], [place] [address]    Culture   Final    NO GROWTH Performed at [facility], [address] [place], [place] [address]    Report Status 11/09/2018 FINAL  Final     Time coordinating discharge: 35 minutes  SIGNED:   Lanae Boast, MD  [facility] 11/10/2018, 10:00 AM  If 7PM-7AM, please contact night-coverage [email or web address]

## 2018-11-11 LAB — CULTURE, BLOOD (ROUTINE X 2): Special Requests: ADEQUATE

## 2018-11-14 ENCOUNTER — Telehealth: Payer: Self-pay | Admitting: Cardiovascular Disease

## 2018-11-14 LAB — UPEP/UIFE/LIGHT CHAINS/TP, 24-HR UR
% BETA, Urine: 16.8 %
ALPHA 1 URINE: 8.2 %
Albumin, U: 10.2 %
Alpha 2, Urine: 28 %
Free Kappa Lt Chains,Ur: 242.64 mg/L — ABNORMAL HIGH (ref 0.63–113.79)
Free Kappa/Lambda Ratio: 4.05 (ref 1.03–31.76)
Free Lambda Lt Chains,Ur: 59.86 mg/L — ABNORMAL HIGH (ref 0.47–11.77)
GAMMA GLOBULIN URINE: 36.7 %
Total Protein, Urine-Ur/day: 616 mg/24 hr — ABNORMAL HIGH (ref 30–150)
Total Protein, Urine: 26.8 mg/dL
Total Volume: 2300

## 2018-11-14 NOTE — Telephone Encounter (Signed)
Went to review this information with Dr Tamala Julian however the call had already been sent to him and has been addressed.

## 2018-11-14 NOTE — Telephone Encounter (Signed)
New Message     Colletta Maryland from [facility] is calling anfd they need the pt to be seen if possible today  BP is 213/130 and HR 120  Abnormal EKG

## 2018-11-14 NOTE — Progress Notes (Signed)
Cardiology Office Note:    Date:  11/15/2018   ID:  Jeffrey Hicks, DOB April 04, 1946, MRN [id number]  PCP:  Jeffrey Rim, MD  Cardiologist:  Jeffrey Moores, MD   Electrophysiologist:  None   Referring MD: Jeffrey Rim, MD   Chief Complaint  Patient presents with  . Hospitalization Follow-up    admx with hypotension, AKI     History of Present Illness:    Jeffrey Hicks is a 73 y.o. male with:  Permanent atrial fibrillation   CHADS2-VASc=2 (age, HTN); Eliquis   Hypertension  Hyperlipidemia   Jeffrey Hicks was last seen by Jeffrey Hicks in 12/2017.  He was recently admitted 7/19-7/23 with symptomatic hypotension with assoc AKI (Creatinine 4.1).  BP and Creatinine improved with holding antihypertensives and administering IVFs.  He was treated with antibiotics due to possible UTI/?sepsis.  His PCP called yesterday during his follow up visit and noted that his BP and HR were both elevated.    He returns for follow up.  He is here alone.  He notes increased HR, BP and shortness of breath with minimal activity since DC.  He is feeling better today after his PCP resumed his HCTZ and Coreg.  He was sleeping in a chair b/c of orthopnea.  However, he can now lay flat.  His legs were more swollen, but they are now improved.  He has not had chest pain, syncope, bleeding issues.    Prior CV studies:   The following studies were reviewed today:  Echocardiogram 11/07/2018 EF 60-65, mild MAC, lipomatous interatrial septum, mod LAE, mild RAE, mild MR, mild TR  Echocardiogram 11/05/14 - Normal biventricular systolic function.   Abnormal diastolic function, however unable to determine degree   secondary to atrial fibrillation.   Severe left atrial and mild right atrial dilatation.   Mild mitral and moderate tricuspid regurgitation.   Normal RVSP.    Past Medical History:  Diagnosis Date  . Hyperlipidemia   . Hypertension   . Left ventricular outflow tract obstruction     Surgical Hx: The patient  has a past surgical history that includes Other surgical history.   Current Medications: Current Meds  Medication Sig  . acetaminophen (TYLENOL) 650 MG CR tablet Take 650 mg by mouth every 8 (eight) hours as needed for pain.  Marland Kitchen allopurinol (ZYLOPRIM) 100 MG tablet Take 100 mg by mouth daily.   . colchicine 0.6 MG tablet Take 2 tablets by mouth See admin instructions. Take 2 tablets by mouth at first sign of gout and may repeat dose in one hour  . ELIQUIS 5 MG TABS tablet TAKE ONE TABLET BY MOUTH TWICE DAILY  . ezetimibe (ZETIA) 10 MG tablet Take 10 mg by mouth daily.  . hydrochlorothiazide (HYDRODIURIL) 12.5 MG tablet Take 12.5 mg by mouth daily.  . Multiple Vitamin (MULTIVITAMIN) tablet Take 1 tablet by mouth daily.    . Omega-3 Fatty Acids (FISH OIL PO) Take 1,000 mg by mouth daily.   . [DISCONTINUED] carvedilol (COREG) 12.5 MG tablet Take 12.5 mg by mouth 2 (two) times daily with a meal.     Allergies:   Crestor  [rosuvastatin calcium] and Lipitor  [atorvastatin]   Social History   Tobacco Use  . Smoking status: Former Smoker    Quit date: 08/25/2000    Years since quitting: 18.2  . Smokeless tobacco: Never Used  Substance Use Topics  . Alcohol use: No  . Drug use: No     Family Hx:  The patient's family history includes Diabetes in his brother; Stroke in his mother; Transient ischemic attack in his father.  ROS:   Please see the history of present illness.    ROS All other systems reviewed and are negative.   EKGs/Labs/Other Test Reviewed:    EKG:  EKG i ordered today.  The ekg ordered today demonstrates atrial fibrillation, HR 96, normal axis, non-specific ST-TW changes, QTc 472, no changes.   Recent Labs: 11/09/2018: ALT 31; Magnesium 2.2 11/10/2018: BUN 40; Creatinine, Ser 2.06; Hemoglobin 11.7; Platelets 122; Potassium 4.5; Sodium 139   Recent Lipid Panel Lab Results  Component Value Date/Time   CHOL 187 03/31/2012 08:02 AM   TRIG 54.0  03/31/2012 08:02 AM   HDL 64.00 03/31/2012 08:02 AM   CHOLHDL 3 03/31/2012 08:02 AM   LDLCALC 112 (H) 03/31/2012 08:02 AM   LDLDIRECT 139.9 10/15/2011 10:19 AM      Physical Exam:    VS:  BP (!) 170/106   Pulse 96   Ht _0  (1.778 m)   Wt 214 lb 12.8 oz (97.4 kg)   SpO2 90%   BMI 30.82 kg/m     Wt Readings from Last 3 Encounters:  11/15/18 214 lb 12.8 oz (97.4 kg)  11/10/18 222 lb 0.1 oz (100.7 kg)  12/29/17 208 lb 1.9 oz (94.4 kg)     Physical Exam  Constitutional: He is oriented to person, place, and time. He appears well-developed and well-nourished. No distress.  HENT:  Head: Normocephalic and atraumatic.  Eyes: No scleral icterus.  Neck: No JVD present. No thyromegaly present.  Cardiovascular: Normal rate. An irregularly irregular rhythm present.  Murmur heard.  Holosystolic murmur is present with a grade of 2/6 at the lower left sternal border. Pulmonary/Chest: Effort normal and breath sounds normal. He has no rales.  Abdominal: Soft. There is no hepatomegaly.  Musculoskeletal:        General: Edema (1+ bilat ankle edema) present.  Lymphadenopathy:    He has no cervical adenopathy.  Neurological: He is alert and oriented to person, place, and time.  Skin: Skin is warm and dry.  Psychiatric: He has a normal mood and affect.    ASSESSMENT & PLAN:    1. Essential hypertension BP is uncontrolled.  He was taken off of Lisinopril, Coreg, HCTZ when he was in the hospital for hypotension, AKI.  His PCP restarted HCTZ, Coreg yesterday.  He was on Hydralazine but this was stopped yesterday.  I would try to avoid ACE inhibitor or angiotensin receptor blocker for now.  Consider adding Amlodipine if his BP remains high.  Continue HCTZ 12.5 mg once daily.  Increase Coreg to 18.75 mg twice daily.  Obtain follow up BMET in 1 week.  He will send my blood pressure readings in a week.  FU with me in 2 weeks.   2. Acute diastolic CHF (congestive heart failure) (HCC)  Echocardiogram in the hospital recently with normal EF.  He notes symptoms of volume excess that seems to have been caused by uncontrolled HR.  He is feeling better now and he has minimal physical findings of volume excess now.  Continue current dose of HCTZ.  I suspect he will continue to feel better with better controlled HR.    3. Permanent atrial fibrillation As noted, his HR was uncontrolled and is now better with resuming his beta-blocker.  Continue anticoagulation with Eliquis.    4. AKI (acute kidney injury) ([place]) Creatinine recently improved.  Repeat BMET next week  with resuming HCTZ.    Dispo:  Return in about 2 weeks (around 11/29/2018) for Close Follow Up, w/ Richardson Dopp, PA-C.   Medication Adjustments/Labs and Tests Ordered: Current medicines are reviewed at length with the patient today.  Concerns regarding medicines are outlined above.  Tests Ordered: Orders Placed This Encounter  Procedures  . Basic metabolic panel  . EKG 12-Lead   Medication Changes: Meds ordered this encounter  Medications  . carvedilol (COREG) 12.5 MG tablet    Sig: Take 1.5 tablets (18.75 mg total) by mouth 2 (two) times daily.    Dispense:  270 tablet    Refill:  3    Signed, Richardson Dopp, PA-C  11/15/2018 5:33 PM    [facility] [place], [place], [place]  [address] Phone: [phone]; Fax: [phone]

## 2018-11-14 NOTE — Telephone Encounter (Signed)
Returned call to phone # listed - [facility] -  Call answered by recording stating to hold to speak with someone.  Call was transferred but phone rang multiple times with no answer and no way to leave a message will continue to attempt to reach pt/[facility] at # listed.  P does have an appt 7/28 with Richardson Dopp.  Pt recently in the hospital - went to ER for evaluation of lightheadedness, weakness and sweats.  Hypotensive 82/58. Antihypertensives and diuretics held.  Acute kidney injury BUN 53 Crea 4.01 on 7/19.  BUN 40 Crea 2.06 7/23 at discharge. Pt had been on Carvedilol 12.5 mg BID, Hydrochlorothiazide 12.5 mg and Lisinopril prior to admit.   11/10/2018 BP was 162/91 HR 91.  Pt started on Hydralazine 25 mg Q 8hr.

## 2018-11-15 ENCOUNTER — Encounter: Payer: Self-pay | Admitting: Physician Assistant

## 2018-11-15 ENCOUNTER — Ambulatory Visit (INDEPENDENT_AMBULATORY_CARE_PROVIDER_SITE_OTHER): Payer: Medicare Other | Admitting: Physician Assistant

## 2018-11-15 ENCOUNTER — Other Ambulatory Visit: Payer: Self-pay

## 2018-11-15 VITALS — BP 170/106 | HR 96 | Ht 70.0 in | Wt 214.8 lb

## 2018-11-15 DIAGNOSIS — N179 Acute kidney failure, unspecified: Secondary | ICD-10-CM

## 2018-11-15 DIAGNOSIS — I5031 Acute diastolic (congestive) heart failure: Secondary | ICD-10-CM

## 2018-11-15 DIAGNOSIS — I10 Essential (primary) hypertension: Secondary | ICD-10-CM

## 2018-11-15 DIAGNOSIS — I4821 Permanent atrial fibrillation: Secondary | ICD-10-CM | POA: Diagnosis not present

## 2018-11-15 LAB — CULTURE, BLOOD (ROUTINE X 2): Special Requests: ADEQUATE

## 2018-11-15 MED ORDER — CARVEDILOL 12.5 MG PO TABS: 18.7500 mg | ORAL_TABLET | Freq: Two times a day (BID) | ORAL | 3 refills | Status: DC

## 2018-11-15 NOTE — Patient Instructions (Addendum)
Medication Instructions:  Your physician has recommended you make the following change in your medication:  1. INCREASE COREG TO 18.75 TWICE DAILY.  If you need a refill on your cardiac medications before your next appointment, please call your pharmacy.   Lab work: TO BE DONE IN 1 WEEK: BMET If you have labs (blood work) drawn today and your tests are completely normal, you will receive your results only by: Marland Kitchen MyChart Message (if you have MyChart) OR . A paper copy in the mail If you have any lab test that is abnormal or we need to change your treatment, we will call you to review the results.  Testing/Procedures: NONE  Follow-Up: . Your physician recommends that you schedule a follow-up appointment in: [place], [facility]   Any Other Special Instructions Will Be Listed Below (If Applicable). Please check your blood pressure  daily for 1 week and send readings to [employer], PA-C. Call our office if systolic (top number) is 078 or greater or diastolic (bottom number) is greater than 95. [phone].

## 2018-11-23 ENCOUNTER — Other Ambulatory Visit: Payer: Medicare Other | Admitting: *Deleted

## 2018-11-23 ENCOUNTER — Other Ambulatory Visit: Payer: Self-pay

## 2018-11-23 DIAGNOSIS — I5031 Acute diastolic (congestive) heart failure: Secondary | ICD-10-CM

## 2018-11-23 DIAGNOSIS — I4821 Permanent atrial fibrillation: Secondary | ICD-10-CM

## 2018-11-23 DIAGNOSIS — I10 Essential (primary) hypertension: Secondary | ICD-10-CM

## 2018-11-23 DIAGNOSIS — N179 Acute kidney failure, unspecified: Secondary | ICD-10-CM

## 2018-11-23 LAB — BASIC METABOLIC PANEL
BUN/Creatinine Ratio: 12 (ref 10–24)
BUN: 16 mg/dL (ref 8–27)
CO2: 26 mmol/L (ref 20–29)
Calcium: 9.1 mg/dL (ref 8.6–10.2)
Chloride: 97 mmol/L (ref 96–106)
Creatinine, Ser: 1.36 mg/dL — ABNORMAL HIGH (ref 0.76–1.27)
GFR calc Af Amer: 59 mL/min/{1.73_m2} — ABNORMAL LOW (ref >59)
GFR calc non Af Amer: 51 mL/min/{1.73_m2} — ABNORMAL LOW (ref >59)
Glucose: 111 mg/dL — ABNORMAL HIGH (ref 65–99)
Potassium: 4.6 mmol/L (ref 3.5–5.2)
Sodium: 136 mmol/L (ref 134–144)

## 2018-11-25 NOTE — Progress Notes (Signed)
Pt has been made aware of normal result and verbalized understanding.  jw 11/25/2018

## 2018-12-01 ENCOUNTER — Telehealth: Payer: Self-pay

## 2018-12-01 NOTE — Telephone Encounter (Signed)
Called pt to review meds and get consent. Family member answered the phone and stated that he has stepped out and would have him call our office back

## 2018-12-01 NOTE — Progress Notes (Signed)
Virtual Visit via Telephone Note   This visit type was conducted due to national recommendations for restrictions regarding the COVID-19 Pandemic (e.g. social distancing) in an effort to limit this patient's exposure and mitigate transmission in our community.  Due to his co-morbid illnesses, this patient is at least at moderate risk for complications without adequate follow up.  This format is felt to be most appropriate for this patient at this time.  The patient did not have access to video technology/had technical difficulties with video requiring transitioning to audio format only (telephone).  All issues noted in this document were discussed and addressed.  No physical exam could be performed with this format.  Please refer to the patient's chart for his  consent to telehealth for [facility].   Date:  12/02/2018   ID:  Jeffrey Hicks, DOB Dec 13, 1945, MRN [id number]  Patient Location: Home Provider Location: Home  PCP:  Corrington, Delsa Grana, MD  Cardiologist:  Mertie Moores, MD   Electrophysiologist:  None   Evaluation Performed:  Follow-Up Visit  Chief Complaint:  FU on HTN, AFib   History of Present Illness:    Jeffrey Hicks is a 73 y.o. male with:  Permanent atrial fibrillation  ? CHADS2-VASc=2 (age, HTN); Eliquis   Hypertension  Hyperlipidemia   Jeffrey Hicks was last seen 11/15/2018.  He had recently been admitted with symptomatic hypotension and associated acute kidney injury with creatinine up to 4.1.  Blood pressure medications were held.  After discharge, his blood pressure and heart rate were uncontrolled.  HCTZ and carvedilol were resumed.  His blood pressure remained uncontrolled and carvedilol was increased further to 18.75 mg twice daily.  Today, he notes he is doing well.  He has not had chest pain, shortness of breath, swelling.  His BPs have been optimal.  He has skipped the extra 1/2 dose of Coreg many times due to lower BP.   The patient does not have  symptoms concerning for COVID-19 infection (fever, chills, cough, or new shortness of breath).    Past Medical History:  Diagnosis Date  . Echocardiogram    EF 60-65, mild MAC, lipomatous interatrial septum, mod LAE, mild RAE, mild MR, mild TR  . Hyperlipidemia   . Hypertension   . Permanent atrial fibrillation    CHADS2-VASc=2; Eliquis    Past Surgical History:  Procedure Laterality Date  . OTHER SURGICAL HISTORY     no surgical HX     Current Meds  Medication Sig  . acetaminophen (TYLENOL) 650 MG CR tablet Take 650 mg by mouth every 8 (eight) hours as needed for pain.  Marland Kitchen allopurinol (ZYLOPRIM) 100 MG tablet Take 100 mg by mouth daily.   Marland Kitchen ELIQUIS 5 MG TABS tablet TAKE ONE TABLET BY MOUTH TWICE DAILY  . ezetimibe (ZETIA) 10 MG tablet Take 10 mg by mouth daily.  . hydrochlorothiazide (HYDRODIURIL) 12.5 MG tablet Take 12.5 mg by mouth daily.  . Multiple Vitamin (MULTIVITAMIN) tablet Take 1 tablet by mouth daily.    . Omega-3 Fatty Acids (FISH OIL PO) Take 1,000 mg by mouth daily.   . [DISCONTINUED] carvedilol (COREG) 12.5 MG tablet Take 1.5 tablets (18.75 mg total) by mouth 2 (two) times daily.     Allergies:   Crestor  [rosuvastatin calcium] and Lipitor  [atorvastatin]   Social History   Tobacco Use  . Smoking status: Former Smoker    Quit date: 08/25/2000    Years since quitting: 18.2  . Smokeless tobacco: Never  Used  Substance Use Topics  . Alcohol use: No  . Drug use: No     Family Hx: The patient's family history includes Diabetes in his brother; Stroke in his mother; Transient ischemic attack in his father.  ROS:   Please see the history of present illness.     All other systems reviewed and are negative.   Prior CV studies:   The following studies were reviewed today:  Echocardiogram 11/07/2018 EF 60-65, mild MAC, lipomatous interatrial septum, mod LAE, mild RAE, mild MR, mild TR   Echocardiogram 11/05/14 - Normal biventricular systolic function.    Abnormal diastolic function, however unable to determine degree   secondary to atrial fibrillation.   Severe left atrial and mild right atrial dilatation.   Mild mitral and moderate tricuspid regurgitation.   Normal RVSP.   Labs/Other Tests and Data Reviewed:    EKG:  No ECG reviewed.  Recent Labs: 11/09/2018: ALT 31; Magnesium 2.2 11/10/2018: Hemoglobin 11.7; Platelets 122 11/23/2018: BUN 16; Creatinine, Ser 1.36; Potassium 4.6; Sodium 136   Recent Lipid Panel Lab Results  Component Value Date/Time   CHOL 187 03/31/2012 08:02 AM   TRIG 54.0 03/31/2012 08:02 AM   HDL 64.00 03/31/2012 08:02 AM   CHOLHDL 3 03/31/2012 08:02 AM   LDLCALC 112 (H) 03/31/2012 08:02 AM   LDLDIRECT 139.9 10/15/2011 10:19 AM    Wt Readings from Last 3 Encounters:  12/02/18 193 lb (87.5 kg)  11/15/18 214 lb 12.8 oz (97.4 kg)  11/10/18 222 lb 0.1 oz (100.7 kg)     Objective:    Vital Signs:  BP 109/68   Pulse 79   Ht _0  (1.778 m)   Wt 193 lb (87.5 kg)   BMI 27.69 kg/m    VITAL SIGNS:  reviewed GEN:  no acute distress RESPIRATORY:  No labored breathing NEURO:  Alert and oriented PSYCH:  Normal mood  ASSESSMENT & PLAN:    1. Essential hypertension Blood pressure is well controlled.  He has actually skipped his extra half a dose of Coreg many times.  Therefore, I recommend that he decrease Coreg back to 12.5 mg twice daily.  If he notes extremely high pressures, he can take the extra half as needed.  2. Chronic atrial fibrillation Rate has been well controlled.  He is tolerating anticoagulation with Apixaban.  Recent creatinine 1.36.  Recent hemoglobin stable..  Follow-up 1 year.  3. COVID-19 Education: The signs and symptoms of COVID-19 were discussed with the patient and how to seek care for testing (follow up with PCP or arrange E-visit).  The importance of social distancing was discussed today.  Time:   Today, I have spent 9.5 minutes with the patient with telehealth technology  discussing the above problems.     Medication Adjustments/Labs and Tests Ordered: Current medicines are reviewed at length with the patient today.  Concerns regarding medicines are outlined above.   Tests Ordered: No orders of the defined types were placed in this encounter.   Medication Changes: Meds ordered this encounter  Medications  . carvedilol (COREG) 12.5 MG tablet    Sig: Take 1 tablet (12.5 mg total) by mouth 2 (two) times daily. Take an extra 6.25 mg as needed for elevated blood pressure.    Dispense:  180 tablet    Refill:  3    Follow Up:  In Person in 1 year(s)  Signed, Richardson Dopp, PA-C  12/02/2018 2:04 PM    [place]

## 2018-12-02 ENCOUNTER — Other Ambulatory Visit: Payer: Self-pay

## 2018-12-02 ENCOUNTER — Telehealth (INDEPENDENT_AMBULATORY_CARE_PROVIDER_SITE_OTHER): Payer: Medicare Other | Admitting: Physician Assistant

## 2018-12-02 ENCOUNTER — Encounter: Payer: Self-pay | Admitting: Physician Assistant

## 2018-12-02 VITALS — BP 109/68 | HR 79 | Ht 70.0 in | Wt 193.0 lb

## 2018-12-02 DIAGNOSIS — I10 Essential (primary) hypertension: Secondary | ICD-10-CM | POA: Diagnosis not present

## 2018-12-02 DIAGNOSIS — I482 Chronic atrial fibrillation, unspecified: Secondary | ICD-10-CM

## 2018-12-02 MED ORDER — CARVEDILOL 12.5 MG PO TABS: 12.5000 mg | ORAL_TABLET | Freq: Two times a day (BID) | ORAL | 3 refills | Status: AC

## 2018-12-02 NOTE — Patient Instructions (Signed)
Medication Instructions:  Your physician has recommended you make the following change in your medication:  1. DECREASE COREG TO 12.5 MG TWICE DAILY. TAKE AN EXTRA 6.25 MG AS NEEDED IF YOUR BLOOD PRESSURE IS SIGNIFICANTLY ELEVATED.  If you need a refill on your cardiac medications before your next appointment, please call your pharmacy.   Lab work: NONE If you have labs (blood work) drawn today and your tests are completely normal, you will receive your results only by: Marland Kitchen MyChart Message (if you have MyChart) OR . A paper copy in the mail If you have any lab test that is abnormal or we need to change your treatment, we will call you to review the results.  Testing/Procedures: NONE  Follow-Up: At [facility], you and your health needs are our priority.  As part of our continuing mission to provide you with exceptional heart care, we have created designated Provider Care Teams.  These Care Teams include your primary Cardiologist (physician) and Advanced Practice Providers (APPs -  Physician Assistants and Nurse Practitioners) who all work together to provide you with the care you need, when you need it. You will need a follow up appointment in:  12 months.  Please call our office 2 months in advance to schedule this appointment.  You may see Mertie Moores, MD or one of the following Advanced Practice Providers on your designated Care Team: Richardson Dopp, PA-C [place], [place] . Daune Perch, NP  Any Other Special Instructions Will Be Listed Below (If Applicable).

## 2019-01-12 ENCOUNTER — Other Ambulatory Visit: Payer: Self-pay | Admitting: Cardiovascular Disease

## 2019-05-22 ENCOUNTER — Other Ambulatory Visit: Payer: Self-pay | Admitting: Cardiovascular Disease

## 2019-05-23 NOTE — Telephone Encounter (Signed)
Prescription refill request for Eliquis received.  Last office visit: [name] 11/15/2018 Scr: 1.36, (11/23/2018) Age: 74 y.o. Weight: 97.4 kg  Prescription refill sent.

## 2019-06-22 ENCOUNTER — Ambulatory Visit: Payer: Medicare Other | Attending: Internal Medicine

## 2019-06-22 DIAGNOSIS — Z23 Encounter for immunization: Secondary | ICD-10-CM | POA: Insufficient documentation

## 2019-06-22 NOTE — Progress Notes (Signed)
   Covid-19 Vaccination Clinic  Name:  Jeffrey Hicks    MRN: [id number] DOB: 1945-07-05  06/22/2019  Jeffrey Hicks was observed post Covid-19 immunization for 15 minutes without incident. He was provided with Vaccine Information Sheet and instruction to access the V-Safe system.   Jeffrey Hicks was instructed to call 911 with any severe reactions post vaccine: Marland Kitchen Difficulty breathing  . Swelling of face and throat  . A fast heartbeat  . A bad rash all over body  . Dizziness and weakness   Immunizations Administered    Name Date Dose VIS Date Route   Pfizer COVID-19 Vaccine 06/22/2019  8:41 AM 0.3 mL 03/31/2019 Intramuscular   Manufacturer: [employer], [employer]   Lot: [id number]   NDC: [address]-7

## 2019-07-18 ENCOUNTER — Ambulatory Visit: Payer: Medicare Other | Attending: Internal Medicine

## 2019-07-18 DIAGNOSIS — Z23 Encounter for immunization: Secondary | ICD-10-CM

## 2019-07-18 NOTE — Progress Notes (Signed)
   Covid-19 Vaccination Clinic  Name:  Jeffrey Hicks    MRN: [id number] DOB: Oct 27, 1945  07/18/2019  [name] was observed post Covid-19 immunization for 15 minutes without incident. He was provided with Vaccine Information Sheet and instruction to access the V-Safe system.   [name] was instructed to call 911 with any severe reactions post vaccine: Marland Kitchen Difficulty breathing  . Swelling of face and throat  . A fast heartbeat  . A bad rash all over body  . Dizziness and weakness   Immunizations Administered    Name Date Dose VIS Date Route   Pfizer COVID-19 Vaccine 07/18/2019 12:36 PM 0.3 mL 03/31/2019 Intramuscular   Manufacturer: [employer], [employer]   Lot: [id number]   NDC: [address]-2

## 2019-12-21 ENCOUNTER — Encounter: Payer: Self-pay | Admitting: Cardiovascular Disease

## 2019-12-21 NOTE — Progress Notes (Signed)
Jeffrey Hicks Date of Birth  Sep 05, 1945       [name]      [address], [place]    [place], [place]  [address]     [phone]       Fax  [phone]       Problem List: 1. Hyperlipidemia 2. Hypertension   Jeffrey Hicks  is doing well. No chest pain or dyspnea. Did not tolerate the Lipator- caused foot pain. He stopped the lipator and the foot pain has mostly improved. He still has some discomfort.  He has been up to his mountain house in [place], [place].   He's been doing lots of yard work. He has not had episodes of chest pain or shortness of breath.  His BP is up a bit today.  His blood pressure readings at home have been normal.  November 03, 2012:  Jeffrey Hicks has been doing well.  He has spent lots of time in [place] .    He has been taking his BP regularly and his readings are in the 105 / 70 range.    His readings are always elevated here in the office.   Jan. 20, 2015  Jeffrey Hicks is doing well.   He records his BP regularly at home and his readings are always normal.  He is exercising - walks 2-3 miles a day.   [place].    His lipids were drawn by his medical doctor. His total cholesterol is 222. His LDL is 139. The triglyceride level is  Ow.  Dec. 8, 2015:  Jeffrey Hicks is doing well.  No CP or dyspnea. Spent more time up in the [place] - was able to fish more.  He is on the [employer].  October 31, 2014:  Jeffrey Hicks  Is doing well.  No CP or dyspnea.  We changed his Bystolic to carvedilol because of cost and the fact that the insurance company would not give him the proper dose of Bystolic.   We initially tried carvedilol 25 mg twice day but he became weak and dizzy. He cut his dose to 12.5 g twice a day which she tolerates much better. His blood pressure and heart rate have been normal. He brought his blood pressure machine and his recordings are all fairly normal.  Remains very busy working on his mountain home up in [place] , [place]   Jan. 23, 2017  Jeffrey Hicks  is seen back today for his persistent atrial fibrillation, hypertension, and hyperlipidemia. Remains on Eliquis . He's doing very well. He's not having any episodes of chest pain or shortness breath. Able to do all that he wants to , Has some gout pains.    August 13, 2016:  Jeffrey Hicks is seen today for follow-up of his hypertension and atrial fibrillation. BP is a little elevated today here in the office.   BP has been ok at home .  Has chronic AF.  Is on Eliquis   Sept . 11 ,2019 Doing well .  No CP   Getting [place] the mountains regularly   Sept. 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 acetaminophen (TYLENOL) 650 MG CR tablet Take 650 mg by mouth every 8 (eight) hours as needed for pain.    Marland Kitchen allopurinol (ZYLOPRIM) 100 MG tablet Take 100 mg by mouth daily.     . carvedilol (COREG) 12.5 MG tablet Take 1 tablet (12.5 mg total) by mouth 2 (two) times daily. Take an extra 6.25 mg as needed for elevated blood pressure. 180 tablet 3  . colchicine 0.6 MG tablet Take 2 tablets by mouth as directed. TAKE TWO TABLETS BY MOUTH AT FIRST SIGN OF GOUT AND MAY REPEAT DOSE IN ONE HOUR    . ELIQUIS 5 MG TABS tablet TAKE ONE TABLET BY MOUTH TWICE DAILY 180 tablet 1  . ezetimibe (ZETIA) 10 MG tablet Take 10 mg by mouth daily.    . hydrochlorothiazide (HYDRODIURIL) 12.5 MG tablet TAKE ONE TABLET BY MOUTH DAILY 90 tablet 3  . Multiple Vitamin (MULTIVITAMIN) tablet Take 1 tablet by mouth daily.      . Omega-3 Fatty Acids (FISH OIL PO) Take 1,000 mg by mouth daily.      No current facility-administered medications on file prior to visit.    Allergies  Allergen Reactions  . Crestor   [Rosuvastatin Calcium] Other (See Comments)    Gout attack  . Lipitor  [Atorvastatin] Other (See Comments)    Gout Attack    Past Medical History:  Diagnosis Date  . Echocardiogram    EF 60-65, mild MAC, lipomatous interatrial septum, mod LAE, mild RAE, mild MR, mild TR  . Hyperlipidemia   . Hypertension   . Permanent atrial fibrillation (HCC)    CHADS2-VASc=2; Eliquis     Past Surgical History:  Procedure Laterality Date  . OTHER SURGICAL HISTORY     no surgical HX    Social History   Tobacco Use  Smoking Status Former Smoker  . Quit date: 08/25/2000  . Years since quitting: 19.3  Smokeless Tobacco Never Used    Social History   Substance and Sexual Activity  Alcohol Use No    Family History  Problem Relation Age of Onset  . Stroke Mother   . Transient ischemic attack Father   . Diabetes Brother     Reviw of Systems:  Reviewed in the HPI.  All other systems are negative.   Physical Exam: Blood pressure 118/72, pulse 81, height _0  (1.778 m), weight 209 lb 9.6 oz (95.1 kg), SpO2 97 %.  GEN:  Well nourished, well developed in no acute distress HEENT: Normal NECK: No JVD; No carotid bruits LYMPHATICS: No lymphadenopathy CARDIAC: irreg. Irreg. ,  Rate is well controlled.  RESPIRATORY:  Clear to auscultation without rales, wheezing or rhonchi  ABDOMEN: Soft, non-tender, non-distended MUSCULOSKELETAL:  No edema; No deformity  SKIN: Warm and dry NEUROLOGIC:  Alert and oriented x 3  ECG:   Sept. 3, 2021:  A-fib with controlled V response.     Assessment / Plan:   1. Atrial fibrillation: He has persistent atrial fibrillation.  His rate is very well controlled and is actually fairly regular for atrial fibrillation.  Continue Eliquis.  Continue carvedilol.  2. Hypertension -  Blood pressure is well controlled.  Continue current medications.  3. Hyperlipidemia -   continue Zetia.  Will check lipids, liver enzymes, basic metabolic profile today.  I'll  see him in 1 year    Mertie Moores, MD  12/22/2019 8:22 AM    [name],  [place], [place]  [address] Pager [phone] Phone: [phone]; Fax: [phone]

## 2019-12-22 ENCOUNTER — Encounter: Payer: Self-pay | Admitting: Cardiovascular Disease

## 2019-12-22 ENCOUNTER — Other Ambulatory Visit: Payer: Self-pay

## 2019-12-22 ENCOUNTER — Ambulatory Visit: Payer: Medicare Other | Admitting: Cardiovascular Disease

## 2019-12-22 VITALS — BP 118/72 | HR 81 | Ht 70.0 in | Wt 209.6 lb

## 2019-12-22 DIAGNOSIS — I10 Essential (primary) hypertension: Secondary | ICD-10-CM | POA: Diagnosis not present

## 2019-12-22 DIAGNOSIS — I4891 Unspecified atrial fibrillation: Secondary | ICD-10-CM | POA: Insufficient documentation

## 2019-12-22 DIAGNOSIS — I482 Chronic atrial fibrillation, unspecified: Secondary | ICD-10-CM

## 2019-12-22 DIAGNOSIS — I4811 Longstanding persistent atrial fibrillation: Secondary | ICD-10-CM | POA: Diagnosis not present

## 2019-12-22 LAB — BASIC METABOLIC PANEL
BUN/Creatinine Ratio: 15 (ref 10–24)
BUN: 27 mg/dL (ref 8–27)
CO2: 26 mmol/L (ref 20–29)
Calcium: 9 mg/dL (ref 8.6–10.2)
Chloride: 98 mmol/L (ref 96–106)
Creatinine, Ser: 1.82 mg/dL — ABNORMAL HIGH (ref 0.76–1.27)
GFR calc Af Amer: 41 mL/min/{1.73_m2} — ABNORMAL LOW (ref >59)
GFR calc non Af Amer: 36 mL/min/{1.73_m2} — ABNORMAL LOW (ref >59)
Glucose: 134 mg/dL — ABNORMAL HIGH (ref 65–99)
Potassium: 4.6 mmol/L (ref 3.5–5.2)
Sodium: 137 mmol/L (ref 134–144)

## 2019-12-22 LAB — CBC WITH DIFFERENTIAL/PLATELET
Basophils Absolute: 0.1 10*3/uL (ref 0.0–0.2)
Basos: 1 %
EOS (ABSOLUTE): 0.2 10*3/uL (ref 0.0–0.4)
Eos: 2 %
Hematocrit: 42 % (ref 37.5–51.0)
Hemoglobin: 14.1 g/dL (ref 13.0–17.7)
Immature Grans (Abs): 0 10*3/uL (ref 0.0–0.1)
Immature Granulocytes: 0 %
Lymphocytes Absolute: 1.7 10*3/uL (ref 0.7–3.1)
Lymphs: 22 %
MCH: 33.2 pg — ABNORMAL HIGH (ref 26.6–33.0)
MCHC: 33.6 g/dL (ref 31.5–35.7)
MCV: 99 fL — ABNORMAL HIGH (ref 79–97)
Monocytes Absolute: 0.9 10*3/uL (ref 0.1–0.9)
Monocytes: 11 %
Neutrophils Absolute: 4.9 10*3/uL (ref 1.4–7.0)
Neutrophils: 64 %
Platelets: 220 10*3/uL (ref 150–450)
RBC: 4.25 x10E6/uL (ref 4.14–5.80)
RDW: 12.6 % (ref 11.6–15.4)
WBC: 7.7 10*3/uL (ref 3.4–10.8)

## 2019-12-22 LAB — LIPID PANEL
Chol/HDL Ratio: 3.3 ratio (ref 0.0–5.0)
Cholesterol, Total: 194 mg/dL (ref 100–199)
HDL: 58 mg/dL (ref >39)
LDL Chol Calc (NIH): 117 mg/dL — ABNORMAL HIGH (ref 0–99)
Triglycerides: 104 mg/dL (ref 0–149)
VLDL Cholesterol Cal: 19 mg/dL (ref 5–40)

## 2019-12-22 LAB — HEPATIC FUNCTION PANEL
ALT: 17 IU/L (ref 0–44)
AST: 17 IU/L (ref 0–40)
Albumin: 4.1 g/dL (ref 3.7–4.7)
Alkaline Phosphatase: 64 IU/L (ref 48–121)
Bilirubin Total: 0.6 mg/dL (ref 0.0–1.2)
Bilirubin, Direct: 0.19 mg/dL (ref 0.00–0.40)
Total Protein: 6.8 g/dL (ref 6.0–8.5)

## 2019-12-22 NOTE — Addendum Note (Signed)
Addended by: Melanee Spry on: 12/22/2019 04:55 PM   Modules accepted: Orders

## 2019-12-22 NOTE — Patient Instructions (Signed)
Medication Instructions:  Your provider recommends that you continue on your current medications as directed. Please refer to the Current Medication list given to you today.   *If you need a refill on your cardiac medications before your next appointment, please call your pharmacy*  Lab Work: TODAY: BMET, CBC, lipids, liver If you have labs (blood work) drawn today and your tests are completely normal, you will receive your results only by: Marland Kitchen MyChart Message (if you have MyChart) OR . A paper copy in the mail If you have any lab test that is abnormal or we need to change your treatment, we will call you to review the results.  Follow-Up: At [facility], you and your health needs are our priority.  As part of our continuing mission to provide you with exceptional heart care, we have created designated Provider Care Teams.  These Care Teams include your primary Cardiologist (physician) and Advanced Practice Providers (APPs -  Physician Assistants and Nurse Practitioners) who all work together to provide you with the care you need, when you need it. Your next appointment:   12 month(s) The format for your next appointment:   In Person Provider:   You may see Kristeen Miss, MD or one of the following Advanced Practice Providers on your designated Care Team:    Tereso Newcomer, PA-C  [place], [place]

## 2020-01-19 DEATH — deceased

## 2020-02-03 ENCOUNTER — Ambulatory Visit: Payer: Medicare Other | Attending: Internal Medicine

## 2020-02-03 DIAGNOSIS — Z23 Encounter for immunization: Secondary | ICD-10-CM

## 2020-02-03 NOTE — Progress Notes (Signed)
   Covid-19 Vaccination Clinic  Name:  ATHANASIUS KESLING    MRN: [id number] DOB: 03/13/1946  02/03/2020  Mr. Mantz was observed post Covid-19 immunization for 15 minutes without incident. He was provided with Vaccine Information Sheet and instruction to access the V-Safe system.   Mr. Brookens was instructed to call 911 with any severe reactions post vaccine: Marland Kitchen Difficulty breathing  . Swelling of face and throat  . A fast heartbeat  . A bad rash all over body  . Dizziness and weakness

## 2020-11-13 IMAGING — CT CT HEAD WITHOUT CONTRAST
3 series · 15 of 47 positions shown, 18 images · non-contrast
Comparison: None.

CLINICAL DATA: Dizziness and encephalopathy

EXAM:
CT HEAD WITHOUT CONTRAST
TECHNIQUE: Contiguous axial images were obtained from the base of the skull
through the vertex without intravenous contrast.

[Series 2: head wo · axial · 0.47mm/px · z∈[-161,-36]mm · 9 of 31 slices shown, 12 images]
[im 3/31  brain]
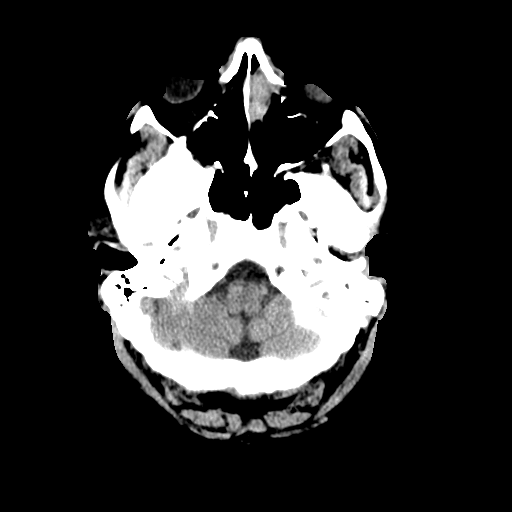
[im 3/31  bone]
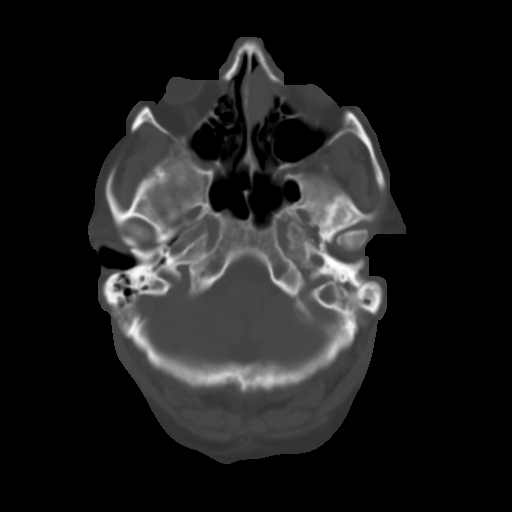
[im 6/31  brain]
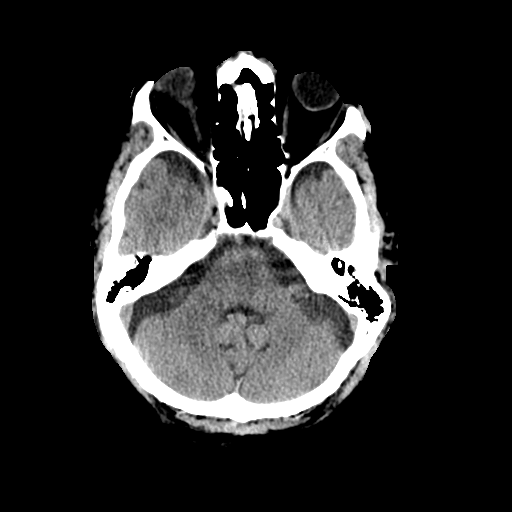
[im 9/31  brain]
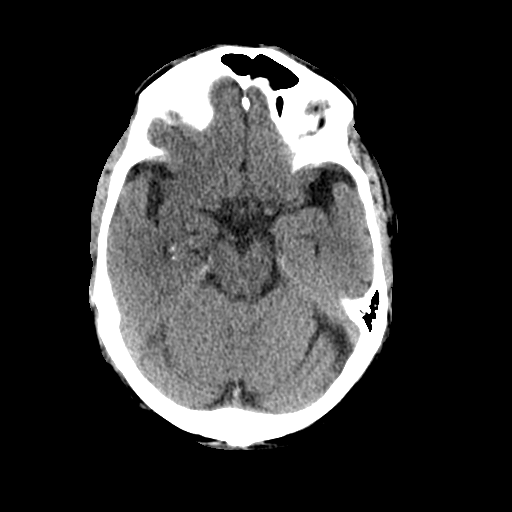
[im 12/31  brain]
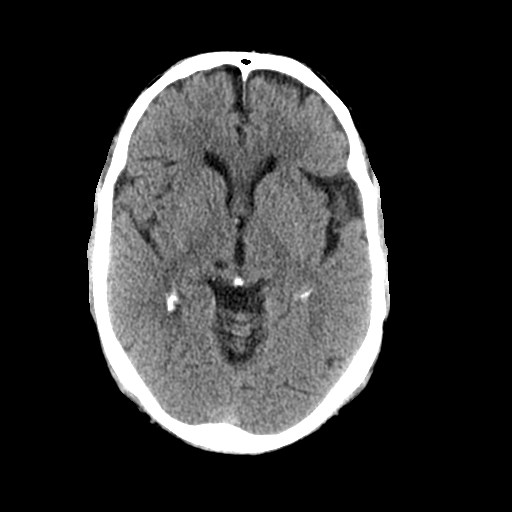
[im 16/31  brain]
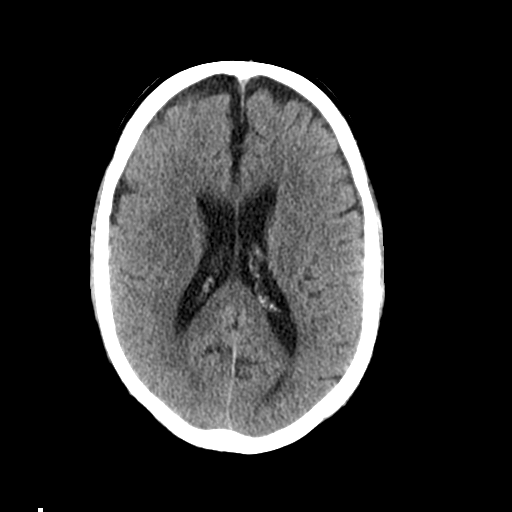
[im 16/31  bone]
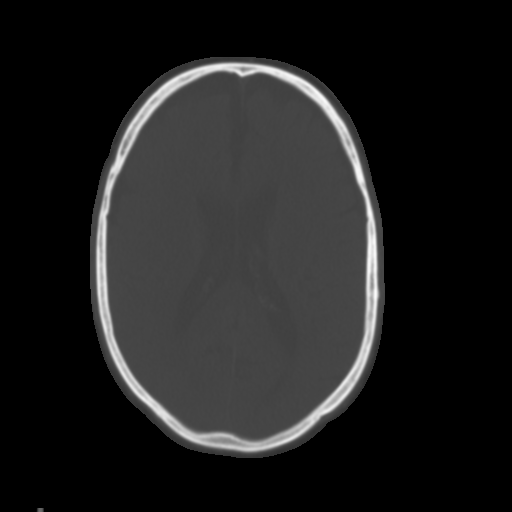
[im 19/31  brain]
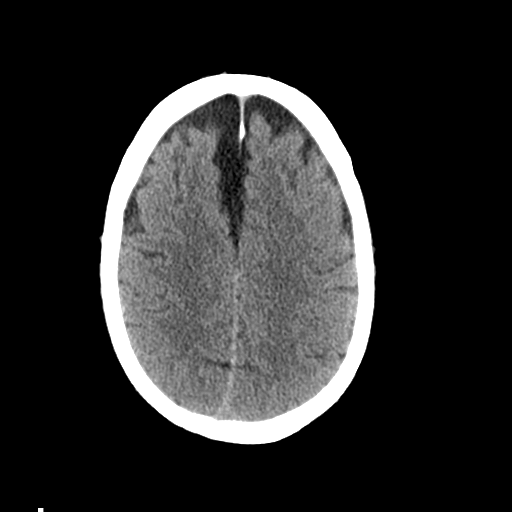
[im 22/31  brain]
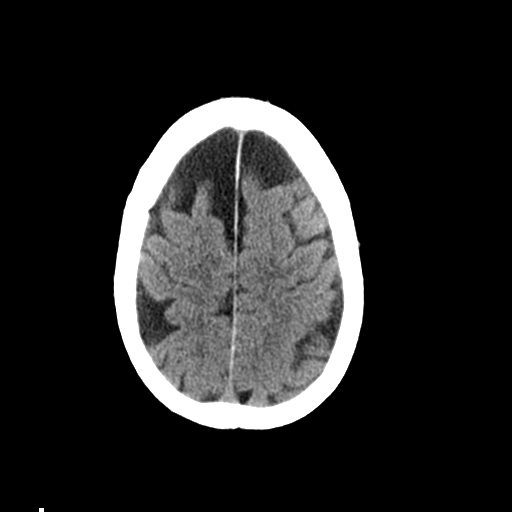
[im 25/31  brain]
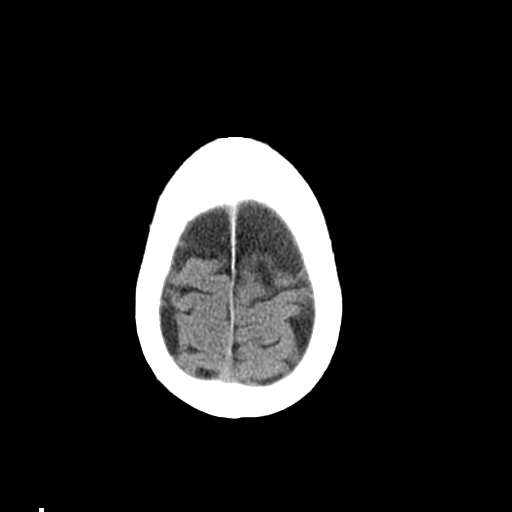
[im 28/31  brain]
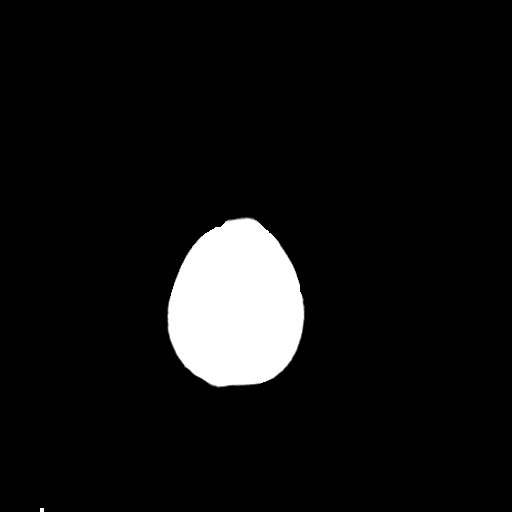
[im 28/31  bone]
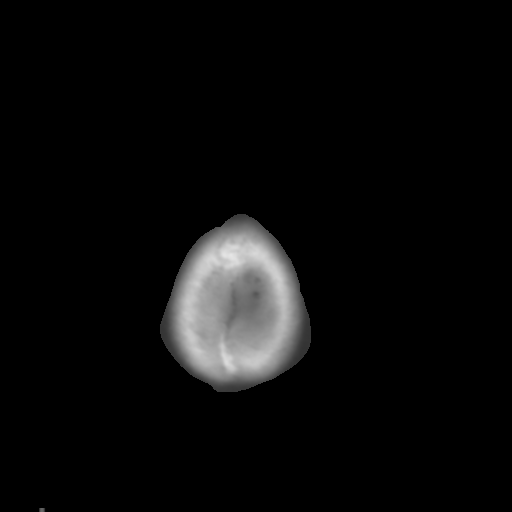

[Series 5: coronal soft tissue · coronal · 0.32mm/px · 3 of 69 slices shown]
[im 23/69  brain]
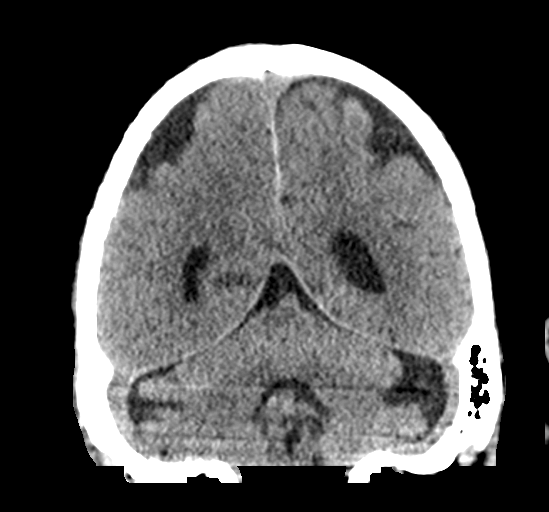
[im 31/69  brain]
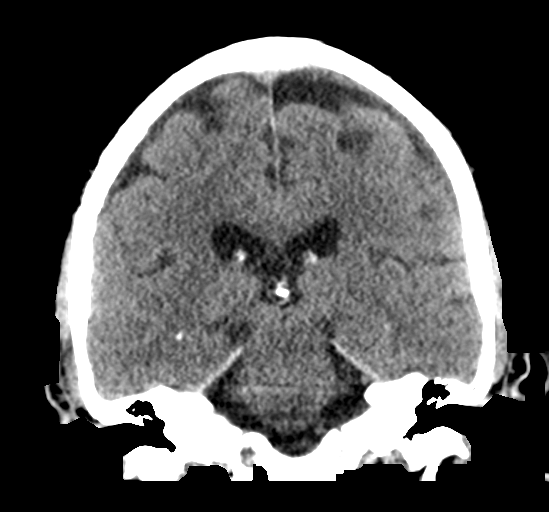
[im 38/69  brain]
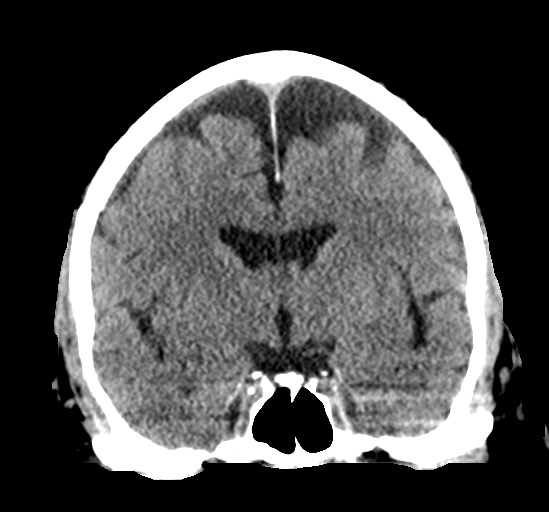

[Series 6: sagittal soft tissue · sagittal · 0.30mm/px · 3 of 59 slices shown]
[im 20/59  brain]
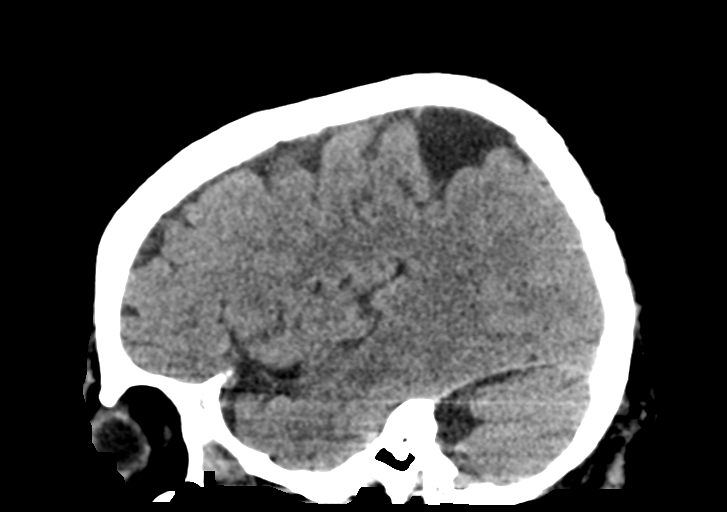
[im 30/59  brain]
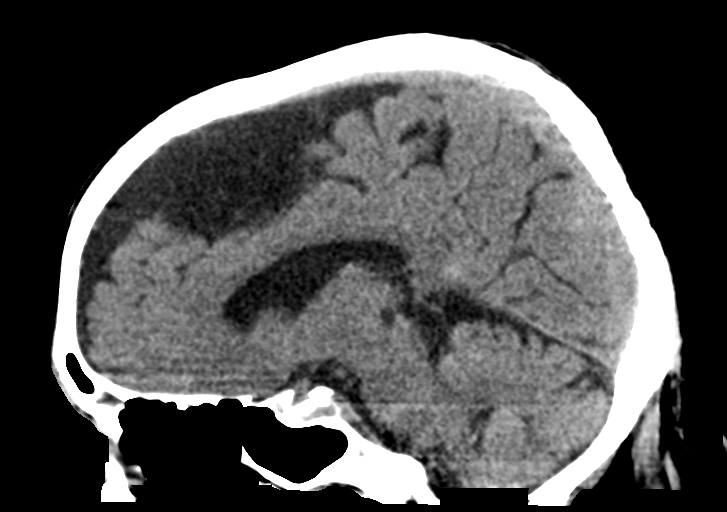
[im 39/59  brain]
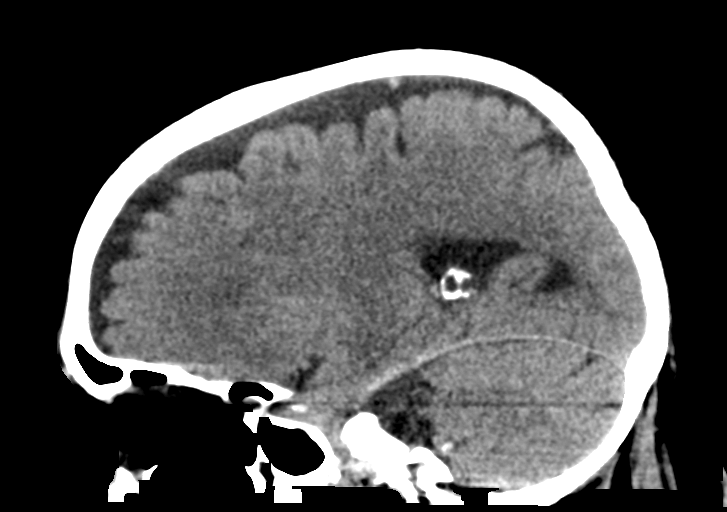

[15 of 47 positions shown; findings below may reference images not displayed]

FINDINGS: Brain: There is no mass, hemorrhage or extra-axial collection. The
appearance of the white matter is normal for the patient's age.
There is generalized atrophy. There is a small, remote infarct of
the dorsal right thalamus.

Vascular: No abnormal hyperdensity of the major intracranial
arteries or dural venous sinuses. No intracranial atherosclerosis.

Skull: The visualized skull base, calvarium and extracranial soft
tissues are normal.

Sinuses/Orbits: Mucosal thickening of the anterior left nasal
cavity. No paranasal sinus fluid level or advanced mucosal
thickening. No mastoid or middle ear effusion. Normal orbits.

Other: None
IMPRESSION: No acute intracranial abnormality.

## 2020-11-13 IMAGING — US US RENAL
1 series · 14 of 25 positions shown · non-contrast
Comparison: CT OF THE ABDOMEN AND PELVIS 11/06/2018

CLINICAL DATA: ACUTE KIDNEY INSUFFICIENCY.

EXAM:
RENAL / URINARY TRACT ULTRASOUND COMPLETE

[Series 1: us renal · 14 of 56 slices shown]
[im 1/56]
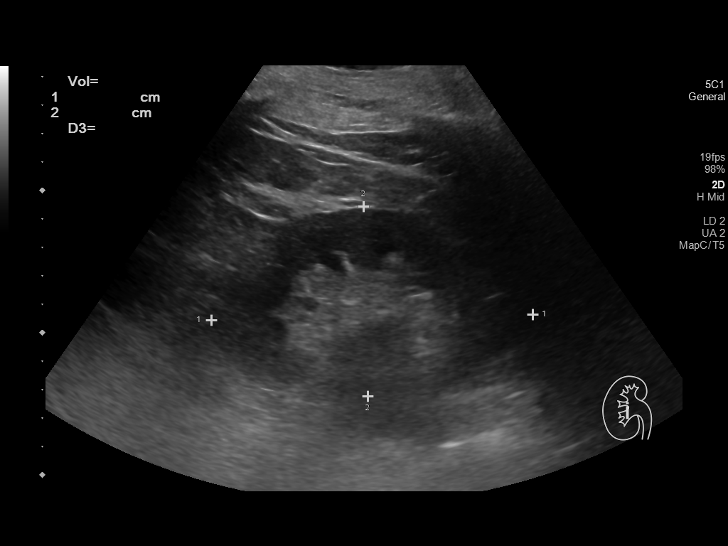
[im 5/56]
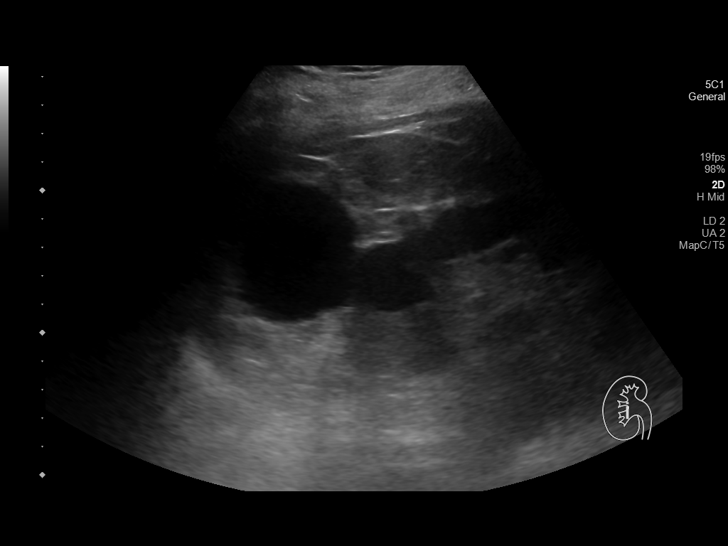
[im 10/56]
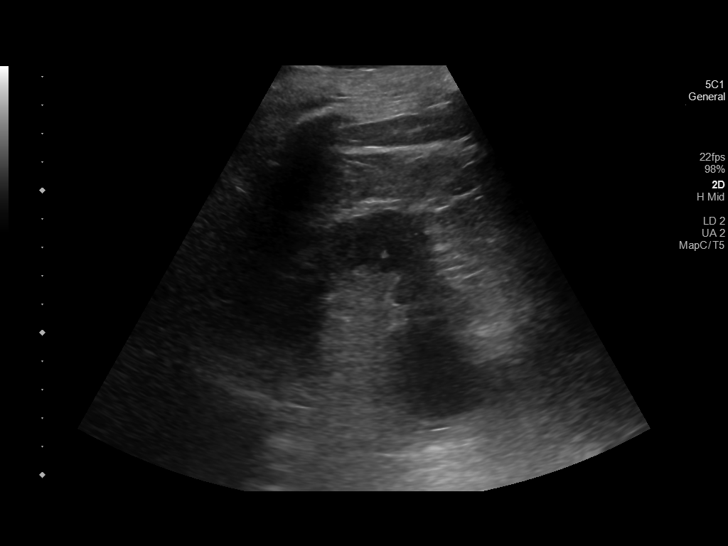
[im 14/56]
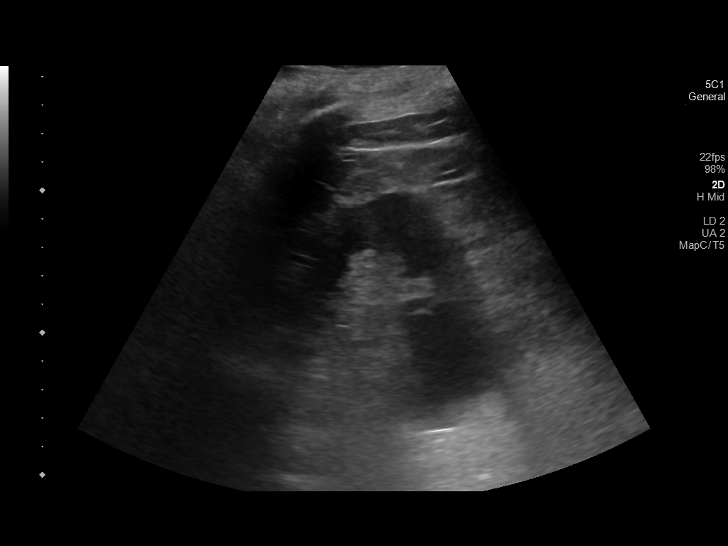
[im 19/56]
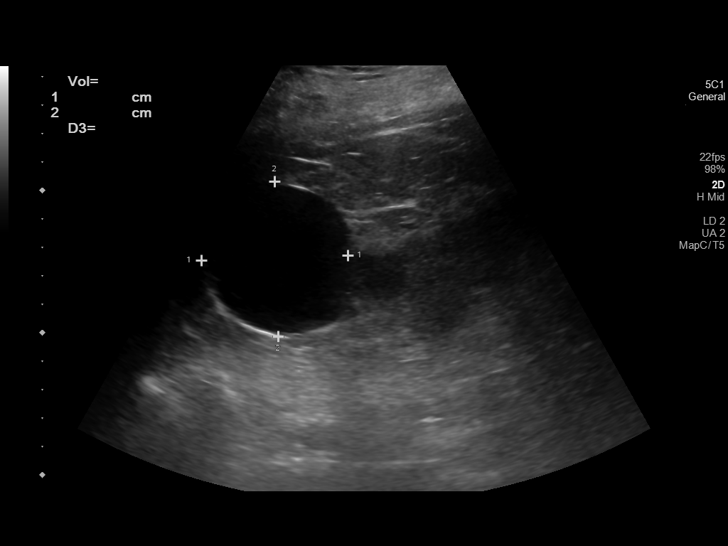
[im 21/56]
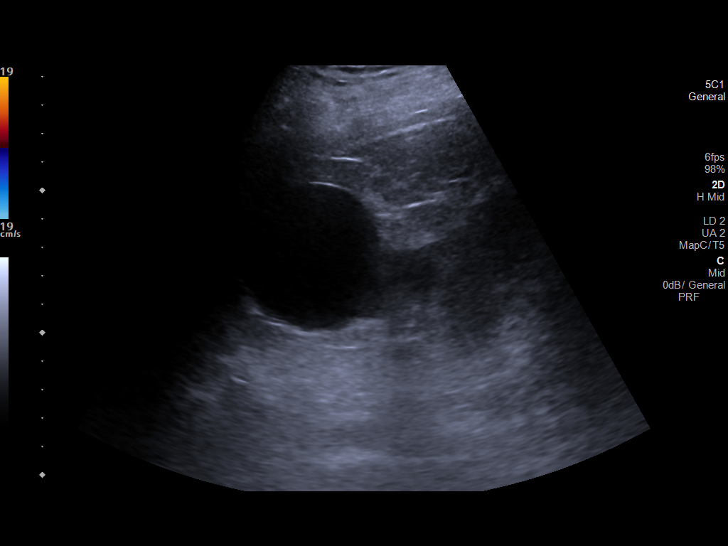
[im 26/56]
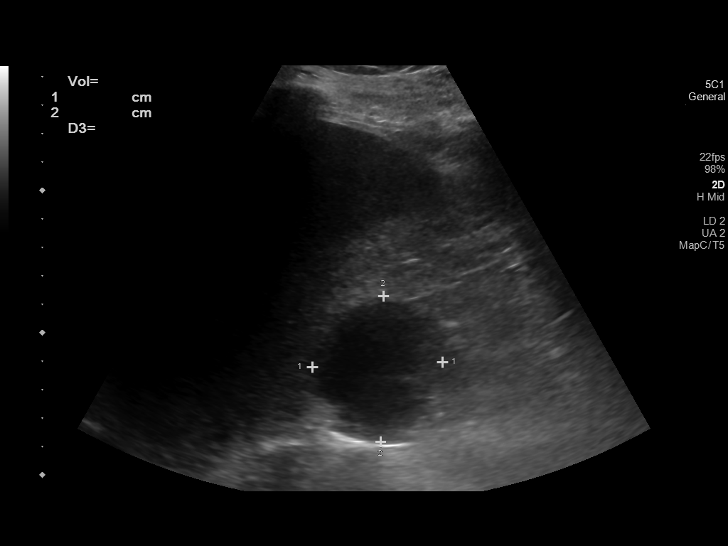
[im 30/56]
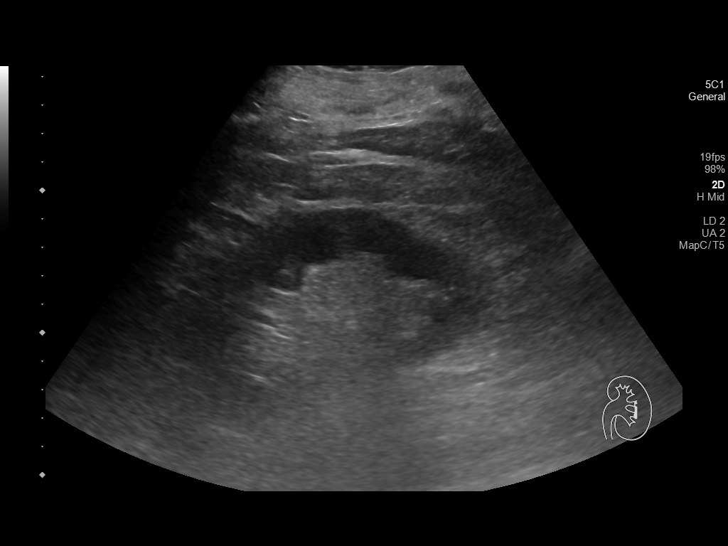
[im 35/56]
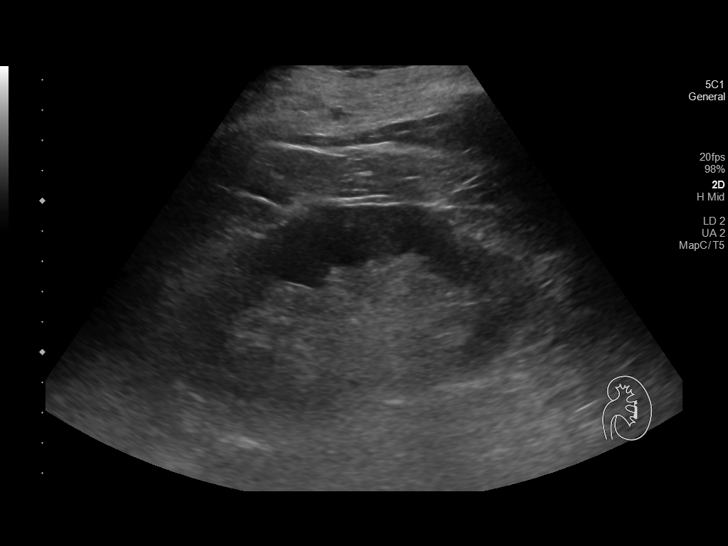
[im 37/56]
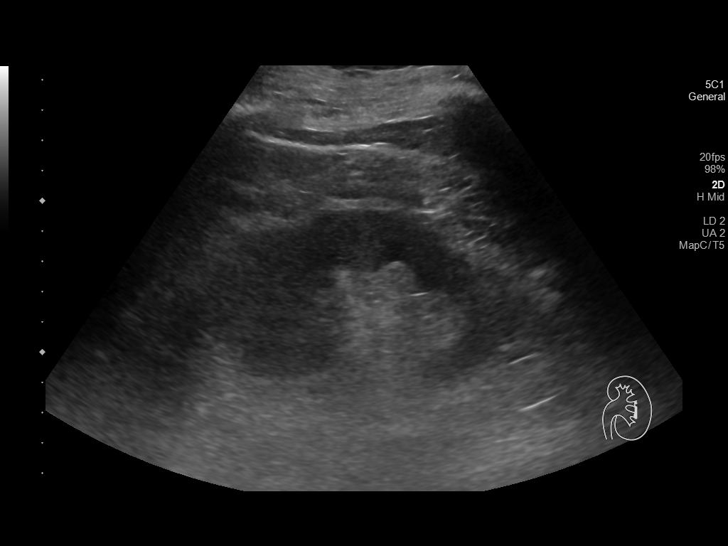
[im 42/56]
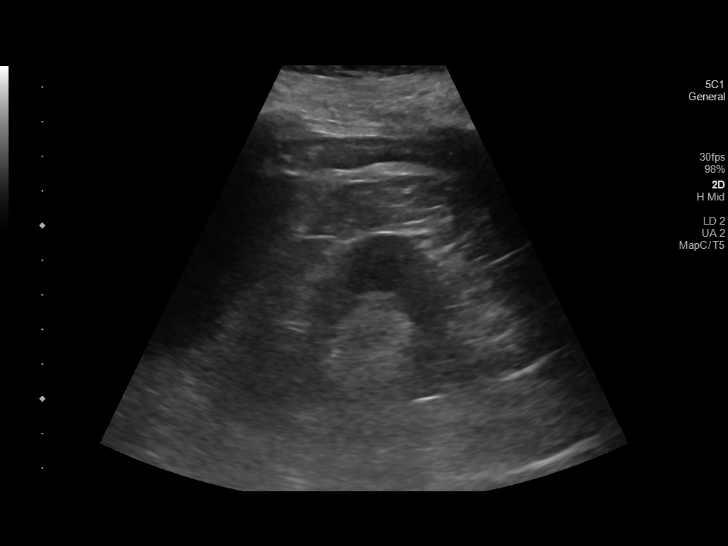
[im 46/56]
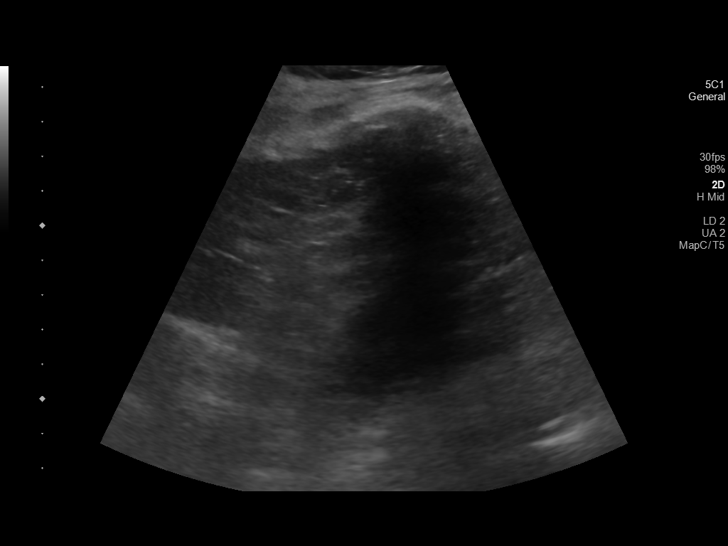
[im 51/56]
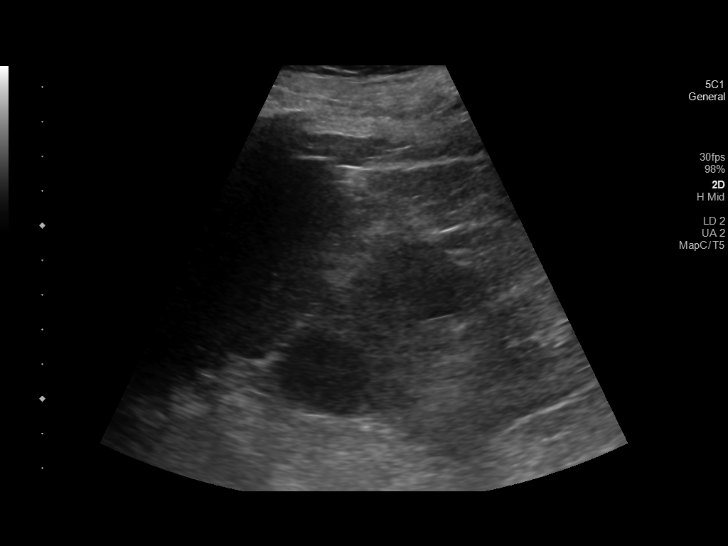
[im 56/56]
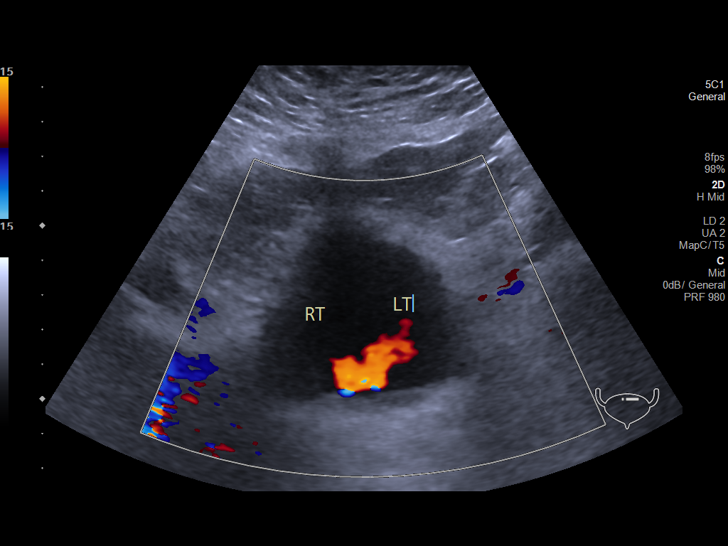

[14 of 25 positions shown; findings below may reference images not displayed]

FINDINGS: Right Kidney:

Renal measurements: 11.3 X 6.6 X 6.3 = volume: 246.5 mL .
Echogenicity within normal limits. To prominent exophytic cysts
measure 5.5 X 5.2 X 4.7 CM and 5.1 X 4.6 X 3.7 CM respectively. No
solid mass lesion is present. There is no hydronephrosis. No stones
are present.

Left Kidney:

Renal measurements: 11.0 x 6.3 x 4.8 cm = volume: 175.8 mL.
Echogenicity within normal limits. The single exophytic cyst at the
upper pole of the left kidney measures 2.3 x 2.2 x 2.7 cm. No solid
mass lesion is present. There is no hydronephrosis. No stones are
present.

Bladder:

Appears normal for degree of bladder distention. Ureteral jets are
evident within the urinary bladder bilaterally
IMPRESSION: 1. Bilateral exophytic renal cysts as described.
2. No solid mass lesion or stone in either kidney.
3. No obstruction.
4. Renal parenchyma is within normal limits for age.

## 2020-11-14 IMAGING — DX PORTABLE CHEST - 1 VIEW
1 series · 1 of 1 positions shown · non-contrast
Comparison: 11/06/2018

CLINICAL DATA: Shortness of breath

EXAM:
PORTABLE CHEST 1 VIEW

[chest ap]
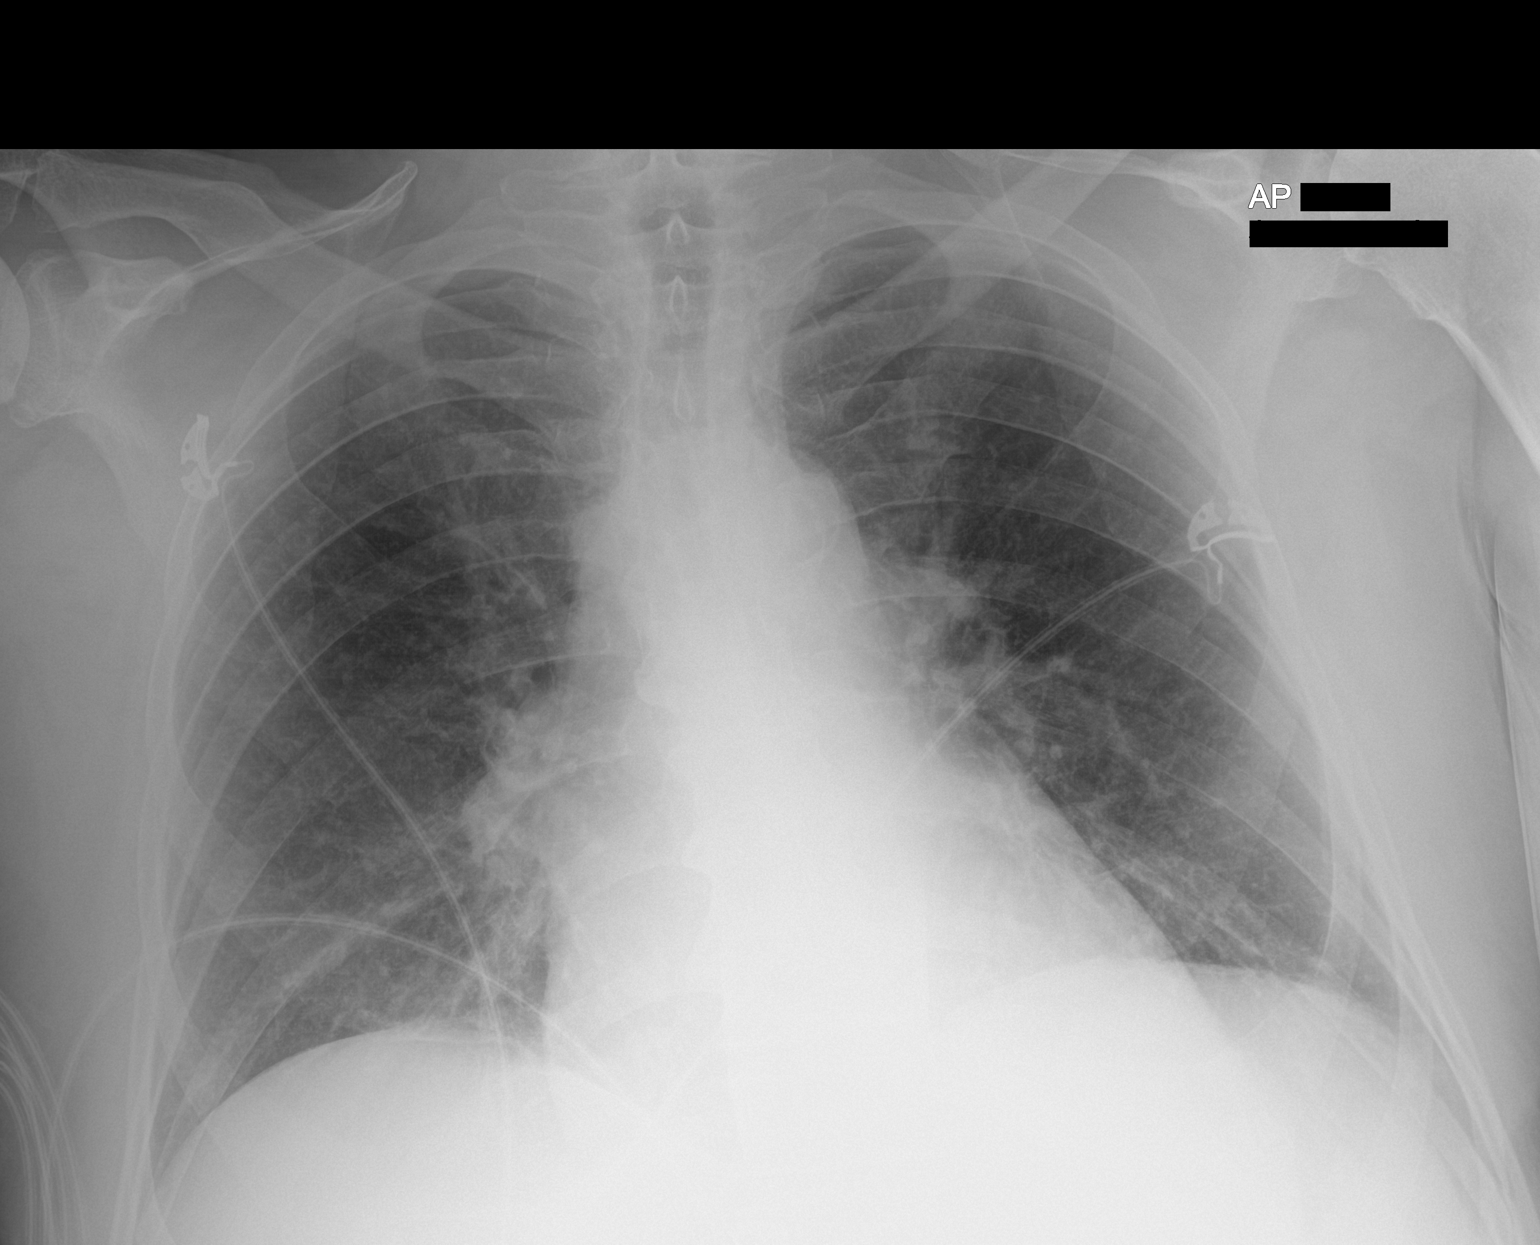

[1 of 1 positions shown; findings below may reference images not displayed]

FINDINGS: Unchanged AP portable radiograph. Mild cardiomegaly without acute
abnormality of the lungs. No new airspace opacity.
IMPRESSION: Unchanged AP portable radiograph. Mild cardiomegaly without acute
abnormality of the lungs. No new airspace opacity.

## 2020-12-10 ENCOUNTER — Telehealth: Payer: Self-pay | Admitting: Cardiovascular Disease

## 2020-12-10 NOTE — Telephone Encounter (Signed)
Spoke with pt and he said he just told the scheduler he wanted a morning appt because we usually draw labs.  He was not looking to have labs prior to his scheduled appt.  Advised pt to come fasting so we can check his cholesterol.  Pt agreeable to plan.

## 2020-12-10 NOTE — Telephone Encounter (Signed)
Patient is requesting orders to have lab work prior to his appointment on 02/19/21 with Dr. Elease Hashimoto.

## 2021-02-18 ENCOUNTER — Encounter: Payer: Self-pay | Admitting: Cardiovascular Disease

## 2021-02-18 NOTE — Progress Notes (Signed)
Jeffrey Hicks Date of Birth  04/28/1945       [facility]      [address], [place]    [place], [place]  [address]     [phone]       Fax  [phone]       Problem List: 1. Hyperlipidemia 2. Hypertension   Jeffrey Hicks  is doing well. No chest pain or dyspnea. Did not tolerate the Lipator- caused foot pain. He stopped the lipator and the foot pain has mostly improved. He still has some discomfort.  He has been up to his [place] house in [place], [place].   He's been doing lots of yard work. He has not had episodes of chest pain or shortness of breath.  His BP is up a bit today.  His blood pressure readings at home have been normal.  November 03, 2012:  Jeffrey Hicks has been doing well.  He has spent lots of time in [place] .    He has been taking his BP regularly and his readings are in the 105 / 70 range.    His readings are always elevated here in the office.   Jan. 20, 2015  Jeffrey Hicks is doing well.   He records his BP regularly at home and his readings are always normal.  He is exercising - walks 2-3 miles a day.   [place] farm.    His lipids were drawn by his medical doctor. His total cholesterol is 222. His LDL is 139. The triglyceride level is  Ow.  Dec. 8, 2015:  Jeffrey Hicks is doing well.  No CP or dyspnea. Spent more time up in the [place] - was able to fish more.  He is on the [employer].  October 31, 2014:  Jeffrey Hicks  Is doing well.  No CP or dyspnea.  We changed his Bystolic to carvedilol because of cost and the fact that the insurance company would not give him the proper dose of Bystolic.   We initially tried carvedilol 25 mg twice day but he became weak and dizzy. He cut his dose to 12.5 g twice a day which she tolerates much better. His blood pressure and heart rate have been normal. He brought his blood pressure machine and his recordings are all fairly normal.  Remains very busy working on his mountain home up in [place] , [place]   Jan. 23, 2017  Jeffrey Hicks  is seen back today for his persistent atrial fibrillation, hypertension, and hyperlipidemia. Remains on Eliquis . He's doing very well. He's not having any episodes of chest pain or shortness breath. Able to do all that he wants to , Has some gout pains.    August 13, 2016:  Jeffrey Hicks is seen today for follow-up of his hypertension and atrial fibrillation. BP is a little elevated today here in the office.   BP has been ok at home .  Has chronic AF.  Is on Eliquis   Sept . 11 ,2019 Doing well .  No CP   [place] the [place] regularly   Sept. 3, 2021  Jeffrey Hicks is seen today for follow up visit for his atrial fib, HTN, hyperlipidemia He was seen last year by virtual visit by Richardson Dopp, PA  Wife passed away this past 2022-07-19 ( dementia related )  Will be selling his place in [place] No CP , no dyspnea Some fatigue    Nov. 2, 2022 Jeffrey Hicks is seen today for follow  up of his ATrial fib, HTN, HLD  [place] [address]  No cp , no dyspnea  Is looking to sell his property in [place].     Current Outpatient Medications on File Prior to Visit  Medication Sig Dispense Refill   acetaminophen (TYLENOL) 650 MG CR tablet Take 650 mg by mouth every 8 (eight) hours as needed for pain.     allopurinol (ZYLOPRIM) 100 MG tablet Take 100 mg by mouth daily.      carvedilol (COREG) 12.5 MG tablet Take 1 tablet (12.5 mg total) by mouth 2 (two) times daily. Take an extra 6.25 mg as needed for elevated blood pressure. 180 tablet 3   colchicine 0.6 MG tablet Take 2 tablets by mouth as directed. TAKE TWO TABLETS BY MOUTH AT FIRST SIGN OF GOUT AND MAY REPEAT DOSE IN ONE HOUR     ELIQUIS 5 MG TABS tablet TAKE ONE TABLET BY MOUTH TWICE DAILY 180 tablet 1   ezetimibe (ZETIA) 10 MG tablet Take 10 mg by mouth daily.     hydrochlorothiazide (HYDRODIURIL) 12.5 MG tablet TAKE ONE TABLET BY MOUTH DAILY 90 tablet 3   Multiple Vitamin (MULTIVITAMIN) tablet Take 1 tablet by mouth daily.       Omega-3 Fatty  Acids (FISH OIL PO) Take 1,000 mg by mouth daily.  (Patient not taking: Reported on 02/19/2021)     No current facility-administered medications on file prior to visit.    Allergies  Allergen Reactions   Crestor  [Rosuvastatin Calcium] Other (See Comments)    Gout attack   Lipitor  [Atorvastatin] Other (See Comments)    Gout Attack    Past Medical History:  Diagnosis Date   Echocardiogram    EF 60-65, mild MAC, lipomatous interatrial septum, mod LAE, mild RAE, mild MR, mild TR   Hyperlipidemia    Hypertension    Permanent atrial fibrillation (HCC)    CHADS2-VASc=2; Eliquis     Past Surgical History:  Procedure Laterality Date   OTHER SURGICAL HISTORY     no surgical HX    Social History   Tobacco Use  Smoking Status Former   Types: Cigarettes   Quit date: 08/25/2000   Years since quitting: 20.5  Smokeless Tobacco Never    Social History   Substance and Sexual Activity  Alcohol Use No    Family History  Problem Relation Age of Onset   Stroke Mother    Transient ischemic attack Father    Diabetes Brother     Reviw of Systems:  Reviewed in the HPI.  All other systems are negative.   Physical Exam: Blood pressure 128/78, pulse 82, height _0  (1.778 m), weight 211 lb 12.8 oz (96.1 kg), SpO2 98 %.  GEN:  Well nourished, well developed in no acute distress HEENT: Normal NECK: No JVD; No carotid bruits LYMPHATICS: No lymphadenopathy CARDIAC: Irreg.Irreg. no murmurs, rubs, gallops RESPIRATORY:  Clear to auscultation without rales, wheezing or rhonchi  ABDOMEN: Soft, non-tender, non-distended MUSCULOSKELETAL:  No edema; No deformity  SKIN: Warm and dry NEUROLOGIC:  Alert and oriented x 3   ECG: February 19, 2021: Atrial fibrillation with rate of 82.  Controlled ventricular spots.  No ST or T wave changes.      Assessment / Plan:   1. Atrial fibrillation: Jeffrey Hicks presents for follow-up of his persistent atrial fibrillation.  His rate is very well  controlled.  He cannot tell that his heartbeat is regular.  He has good  exercise capacity.  Continue Eliquis. We will check a basic metabolic profile and CBC today.   2. Hypertension -   blood pressure is well controlled.  3. Hyperlipidemia -   check lipids, ALT and basic metabolic profile today.  We will have him follow-up in 1 year with me or an APP.   Mertie Moores, MD  02/19/2021 10:00 AM    [address] [place],  [place], [place]  [address] Pager [phone] Phone: [phone]; Fax: [phone]

## 2021-02-19 ENCOUNTER — Ambulatory Visit: Payer: Medicare Other | Admitting: Cardiovascular Disease

## 2021-02-19 ENCOUNTER — Encounter: Payer: Self-pay | Admitting: Cardiovascular Disease

## 2021-02-19 ENCOUNTER — Other Ambulatory Visit: Payer: Self-pay

## 2021-02-19 VITALS — BP 128/78 | HR 82 | Ht 70.0 in | Wt 211.8 lb

## 2021-02-19 DIAGNOSIS — E785 Hyperlipidemia, unspecified: Secondary | ICD-10-CM

## 2021-02-19 DIAGNOSIS — I10 Essential (primary) hypertension: Secondary | ICD-10-CM | POA: Diagnosis not present

## 2021-02-19 DIAGNOSIS — I480 Paroxysmal atrial fibrillation: Secondary | ICD-10-CM

## 2021-02-19 LAB — CBC
Hematocrit: 40.8 % (ref 37.5–51.0)
Hemoglobin: 14 g/dL (ref 13.0–17.7)
MCH: 32.9 pg (ref 26.6–33.0)
MCHC: 34.3 g/dL (ref 31.5–35.7)
MCV: 96 fL (ref 79–97)
Platelets: 259 10*3/uL (ref 150–450)
RBC: 4.25 x10E6/uL (ref 4.14–5.80)
RDW: 12.6 % (ref 11.6–15.4)
WBC: 7.8 10*3/uL (ref 3.4–10.8)

## 2021-02-19 LAB — BASIC METABOLIC PANEL
BUN/Creatinine Ratio: 13 (ref 10–24)
BUN: 19 mg/dL (ref 8–27)
CO2: 27 mmol/L (ref 20–29)
Calcium: 9.5 mg/dL (ref 8.6–10.2)
Chloride: 100 mmol/L (ref 96–106)
Creatinine, Ser: 1.45 mg/dL — ABNORMAL HIGH (ref 0.76–1.27)
Glucose: 118 mg/dL — ABNORMAL HIGH (ref 70–99)
Potassium: 4.9 mmol/L (ref 3.5–5.2)
Sodium: 139 mmol/L (ref 134–144)
eGFR: 50 mL/min/{1.73_m2} — ABNORMAL LOW (ref >59)

## 2021-02-19 LAB — LIPID PANEL
Chol/HDL Ratio: 3.7 ratio (ref 0.0–5.0)
Cholesterol, Total: 186 mg/dL (ref 100–199)
HDL: 50 mg/dL (ref >39)
LDL Chol Calc (NIH): 113 mg/dL — ABNORMAL HIGH (ref 0–99)
Triglycerides: 130 mg/dL (ref 0–149)
VLDL Cholesterol Cal: 23 mg/dL (ref 5–40)

## 2021-02-19 LAB — ALT: ALT: 13 IU/L (ref 0–44)

## 2021-02-19 NOTE — Patient Instructions (Signed)
Medication Instructions:  Your physician recommends that you continue on your current medications as directed. Please refer to the Current Medication list given to you today.  *If you need a refill on your cardiac medications before your next appointment, please call your pharmacy*   Lab Work: BMET, CBC, Lipid and ALT today  If you have labs (blood work) drawn today and your tests are completely normal, you will receive your results only by: MyChart Message (if you have MyChart) OR A paper copy in the mail If you have any lab test that is abnormal or we need to change your treatment, we will call you to review the results.   Testing/Procedures: None   Follow-Up: At [facility], you and your health needs are our priority.  As part of our continuing mission to provide you with exceptional heart care, we have created designated Provider Care Teams.  These Care Teams include your primary Cardiologist (physician) and Advanced Practice Providers (APPs -  Physician Assistants and Nurse Practitioners) who all work together to provide you with the care you need, when you need it.  We recommend signing up for the patient portal called "MyChart".  Sign up information is provided on this After Visit Summary.  MyChart is used to connect with patients for Virtual Visits (Telemedicine).  Patients are able to view lab/test results, encounter notes, upcoming appointments, etc.  Non-urgent messages can be sent to your provider as well.   To learn more about what you can do with MyChart, go to [email or web address].    Your next appointment:   1 year(s)  The format for your next appointment:   In Person  Provider:   You may see Kristeen Miss, MD or one of the following Advanced Practice Providers on your designated Care Team:   Tereso Newcomer, PA-C Chelsea Aus, [place]   Other Instructions

## 2021-02-21 ENCOUNTER — Telehealth: Payer: Self-pay | Admitting: Cardiovascular Disease

## 2021-02-21 NOTE — Telephone Encounter (Signed)
Jeffrey Hicks is returning Jeffrey Hicks's call for his labs.

## 2021-02-21 NOTE — Telephone Encounter (Signed)
The patient has been notified of the result and verbalized understanding.  All questions (if any) were answered. Loa Socks, LPN 35/08/9739 6:38 PM

## 2021-02-21 NOTE — Telephone Encounter (Signed)
-----   Message from Vesta Mixer, MD sent at 02/21/2021 11:58 AM EDT ----- Labs are stable

## 2022-05-07 ENCOUNTER — Encounter: Payer: Self-pay | Admitting: Cardiovascular Disease

## 2022-05-07 ENCOUNTER — Ambulatory Visit: Payer: Medicare Other | Attending: Cardiovascular Disease | Admitting: Cardiovascular Disease

## 2022-05-07 VITALS — BP 130/80 | HR 80 | Ht 70.0 in | Wt 202.2 lb

## 2022-05-07 DIAGNOSIS — I10 Essential (primary) hypertension: Secondary | ICD-10-CM | POA: Diagnosis not present

## 2022-05-07 DIAGNOSIS — E782 Mixed hyperlipidemia: Secondary | ICD-10-CM | POA: Diagnosis not present

## 2022-05-07 DIAGNOSIS — I4821 Permanent atrial fibrillation: Secondary | ICD-10-CM | POA: Diagnosis not present

## 2022-05-07 NOTE — Patient Instructions (Signed)
Medication Instructions:  Your physician recommends that you continue on your current medications as directed. Please refer to the Current Medication list given to you today.  *If you need a refill on your cardiac medications before your next appointment, please call your pharmacy*   Lab Work: NONE If you have labs (blood work) drawn today and your tests are completely normal, you will receive your results only by: MyChart Message (if you have MyChart) OR A paper copy in the mail If you have any lab test that is abnormal or we need to change your treatment, we will call you to review the results.   Testing/Procedures: NONE   Follow-Up: At [facility], you and your health needs are our priority.  As part of our continuing mission to provide you with exceptional heart care, we have created designated Provider Care Teams.  These Care Teams include your primary Cardiologist (physician) and Advanced Practice Providers (APPs -  Physician Assistants and Nurse Practitioners) who all work together to provide you with the care you need, when you need it.  We recommend signing up for the patient portal called "MyChart".  Sign up information is provided on this After Visit Summary.  MyChart is used to connect with patients for Virtual Visits (Telemedicine).  Patients are able to view lab/test results, encounter notes, upcoming appointments, etc.  Non-urgent messages can be sent to your provider as well.   To learn more about what you can do with MyChart, go to [email or web address].    Your next appointment:   1 year(s)  Provider:   Philip [name], MD   

## 2022-05-07 NOTE — Progress Notes (Signed)
Jeffrey Hicks Date of Birth  1945-05-24       [facility]      [address], [place]    [place], [place]  [address]     [phone]       Fax  [phone]       Problem List: 1. Hyperlipidemia 2. Hypertension 3.  Atrial fib    Jeffrey Hicks  is doing well. No chest pain or dyspnea. Did not tolerate the Lipator- caused foot pain. He stopped the lipator and the foot pain has mostly improved. He still has some discomfort.  He has been up to his mountain house in [place], [place].   He's been doing lots of yard work. He has not had episodes of chest pain or shortness of breath.  His BP is up a bit today.  His blood pressure readings at home have been normal.  November 03, 2012:  Jeffrey Hicks has been doing well.  He has spent lots of time in [place] .    He has been taking his BP regularly and his readings are in the 105 / 70 range.    His readings are always elevated here in the office.   Jan. 20, 2015  Jeffrey Hicks is doing well.   He records his BP regularly at home and his readings are always normal.  He is exercising - walks 2-3 miles a day.   [place] farm.    His lipids were drawn by his medical doctor. His total cholesterol is 222. His LDL is 139. The triglyceride level is  Ow.  Dec. 8, 2015:  Jeffrey Hicks is doing well.  No CP or dyspnea. Spent more time up in the [place] - was able to fish more.  He is on the [employer].  October 31, 2014:  Jeffrey Hicks  Is doing well.  No CP or dyspnea.  We changed his Bystolic to carvedilol because of cost and the fact that the insurance company would not give him the proper dose of Bystolic.   We initially tried carvedilol 25 mg twice day but he became weak and dizzy. He cut his dose to 12.5 g twice a day which she tolerates much better. His blood pressure and heart rate have been normal. He brought his blood pressure machine and his recordings are all fairly normal.  Remains very busy working on his mountain home up in [place] , [place]   Jan.  23, 2017  Jeffrey Hicks is seen back today for his persistent atrial fibrillation, hypertension, and hyperlipidemia. Remains on Eliquis . He's doing very well. He's not having any episodes of chest pain or shortness breath. Able to do all that he wants to , Has some gout pains.    August 13, 2016:  Jeffrey Hicks is seen today for follow-up of his hypertension and atrial fibrillation. BP is a little elevated today here in the office.   BP has been ok at home .  Has chronic AF.  Is on Eliquis   Sept . 11 ,2019 Doing well .  No CP   [place]   Sept. 3, 2021  Jeffrey Hicks is seen today for follow up visit for his atrial fib, HTN, hyperlipidemia He was seen last year by virtual visit by Richardson Dopp, PA  Wife passed away this past 2022-07-10 ( dementia related )  Will be selling his place in [place] No CP , no dyspnea Some fatigue    Nov. 2, 2022 Jeffrey Hicks  is seen today for follow up of his ATrial fib, HTN, HLD  [place] [address] [place] [address]  No cp , no dyspnea  Is looking to sell his property in [place].      Jan. 18, 2024  Jeffrey Hicks is sen today for follow up of his atrial fib , HTN, HLD  Still in atrial fib  BP readings are good at home  Has sold all his [place] property.    Current Outpatient Medications on File Prior to Visit  Medication Sig Dispense Refill   acetaminophen (TYLENOL) 650 MG CR tablet Take 650 mg by mouth every 8 (eight) hours as needed for pain.     allopurinol (ZYLOPRIM) 100 MG tablet Take 100 mg by mouth daily.      carvedilol (COREG) 12.5 MG tablet Take 1 tablet (12.5 mg total) by mouth 2 (two) times daily. Take an extra 6.25 mg as needed for elevated blood pressure. 180 tablet 3   colchicine 0.6 MG tablet Take 2 tablets by mouth as directed. TAKE TWO TABLETS BY MOUTH AT FIRST SIGN OF GOUT AND MAY REPEAT DOSE IN ONE HOUR     ELIQUIS 5 MG TABS tablet TAKE ONE TABLET BY MOUTH TWICE DAILY 180 tablet 1   ezetimibe (ZETIA) 10 MG tablet Take 10 mg by mouth  daily.     hydrochlorothiazide (HYDRODIURIL) 12.5 MG tablet TAKE ONE TABLET BY MOUTH DAILY 90 tablet 3   Multiple Vitamin (MULTIVITAMIN) tablet Take 1 tablet by mouth daily.       sildenafil (REVATIO) 20 MG tablet Take 2-5 tablets by mouth one hour prior to sexual activity.     Omega-3 Fatty Acids (FISH OIL PO) Take 1,000 mg by mouth daily. (Patient not taking: Reported on 05/07/2022)     No current facility-administered medications on file prior to visit.    Allergies  Allergen Reactions   Crestor  [Rosuvastatin Calcium] Other (See Comments)    Gout attack   Lipitor  [Atorvastatin] Other (See Comments)    Gout Attack    Past Medical History:  Diagnosis Date   Echocardiogram    EF 60-65, mild MAC, lipomatous interatrial septum, mod LAE, mild RAE, mild MR, mild TR   Hyperlipidemia    Hypertension    Permanent atrial fibrillation (HCC)    CHADS2-VASc=2; Eliquis     Past Surgical History:  Procedure Laterality Date   OTHER SURGICAL HISTORY     no surgical HX    Social History   Tobacco Use  Smoking Status Former   Types: Cigarettes   Quit date: 08/25/2000   Years since quitting: 21.7  Smokeless Tobacco Never    Social History   Substance and Sexual Activity  Alcohol Use No    Family History  Problem Relation Age of Onset   Stroke Mother    Transient ischemic attack Father    Diabetes Brother     Reviw of Systems:  Reviewed in the HPI.  All other systems are negative.    Physical Exam: Blood pressure 130/80, pulse 80, height _0  (1.778 m), weight 202 lb 3.2 oz (91.7 kg), SpO2 98 %.     GEN:  Well nourished, well developed in no acute distress HEENT: Normal NECK: No JVD; No carotid bruits LYMPHATICS: No lymphadenopathy CARDIAC: irreg. Irreg.  RESPIRATORY:  Clear to auscultation without rales, wheezing or rhonchi  ABDOMEN: Soft, non-tender, non-distended MUSCULOSKELETAL:  No edema; No deformity  SKIN: Warm and dry NEUROLOGIC:  Alert and oriented x  3   ECG: May 07, 2022: Atrial fibrillation with a heart rate of 80.  No ST or T wave changes.      Assessment / Plan:   1. Atrial fibrillation: He has permanent atrial fibrillation.  Continue Eliquis.  Continue carvedilol 12.5 mg twice daily.   2. Hypertension -   blood pressure is well-controlled.  He is on low-dose HCTZ.  If his blood pressure increases I would like for him to increase his dose.  He will give [place] a call if his blood pressure starts to go up.  3. Hyperlipidemia -   Labs today     Mertie Moores, MD  05/07/2022 4:26 PM    [place],  [place], [place]  [address] Pager [phone] Phone: [phone]; Fax: [phone]

## 2022-05-08 LAB — BASIC METABOLIC PANEL
BUN/Creatinine Ratio: 12 (ref 10–24)
BUN: 17 mg/dL (ref 8–27)
CO2: 25 mmol/L (ref 20–29)
Calcium: 9.3 mg/dL (ref 8.6–10.2)
Chloride: 102 mmol/L (ref 96–106)
Creatinine, Ser: 1.38 mg/dL — ABNORMAL HIGH (ref 0.76–1.27)
Glucose: 105 mg/dL — ABNORMAL HIGH (ref 70–99)
Potassium: 4.7 mmol/L (ref 3.5–5.2)
Sodium: 143 mmol/L (ref 134–144)
eGFR: 53 mL/min/{1.73_m2} — ABNORMAL LOW (ref >59)

## 2022-05-08 LAB — ALT: ALT: 11 IU/L (ref 0–44)

## 2022-05-08 LAB — LIPID PANEL
Chol/HDL Ratio: 3.3 ratio (ref 0.0–5.0)
Cholesterol, Total: 193 mg/dL (ref 100–199)
HDL: 59 mg/dL (ref >39)
LDL Chol Calc (NIH): 115 mg/dL — ABNORMAL HIGH (ref 0–99)
Triglycerides: 107 mg/dL (ref 0–149)
VLDL Cholesterol Cal: 19 mg/dL (ref 5–40)

## 2022-05-08 LAB — LIPOPROTEIN A (LPA): Lipoprotein (a): 132 nmol/L — ABNORMAL HIGH (ref <75.0)

## 2023-05-10 ENCOUNTER — Encounter: Payer: Self-pay | Admitting: Cardiovascular Disease

## 2023-05-10 NOTE — Progress Notes (Unsigned)
Jeffrey Hicks Date of Birth  1945-08-02       [facility] Office      [address], [address]    [place], [place]  [address]     [phone]       Fax  [phone]       Problem List: 1. Hyperlipidemia 2. Hypertension 3.  Atrial fib    Jeffrey Hicks  is doing well. No chest pain or dyspnea. Did not tolerate the Lipator- caused foot pain. He stopped the lipator and the foot pain has mostly improved. He still has some discomfort.  He has been up to his mountain house in [place], [place].   He's been doing lots of yard work. He has not had episodes of chest pain or shortness of breath.  His BP is up a bit today.  His blood pressure readings at home have been normal.  November 03, 2012:  Jeffrey Hicks has been doing well.  He has spent lots of time in [place] .    He has been taking his BP regularly and his readings are in the 105 / 70 range.    His readings are always elevated here in the office.   Jan. 20, 2015  Jeffrey Hicks is doing well.   He records his BP regularly at home and his readings are always normal.  He is exercising - walks 2-3 miles a day.   [place] farm.    His lipids were drawn by his medical doctor. His total cholesterol is 222. His LDL is 139. The triglyceride level is  Ow.  Dec. 8, 2015:  Jeffrey Hicks is doing well.  No CP or dyspnea. Spent more time up in the [place] - was able to fish more.  He is on the [employer].  October 31, 2014:  Jeffrey Hicks  Is doing well.  No CP or dyspnea.  We changed his Bystolic to carvedilol because of cost and the fact that the insurance company would not give him the proper dose of Bystolic.   We initially tried carvedilol 25 mg twice day but he became weak and dizzy. He cut his dose to 12.5 g twice a day which she tolerates much better. His blood pressure and heart rate have been normal. He brought his blood pressure machine and his recordings are all fairly normal.  Remains very busy working on his mountain home up in [place] , [place]   Jan.  23, 2017  Jeffrey Hicks is seen back today for his persistent atrial fibrillation, hypertension, and hyperlipidemia. Remains on Eliquis . He's doing very well. He's not having any episodes of chest pain or shortness breath. Able to do all that he wants to , Has some gout pains.    August 13, 2016:  Jeffrey Hicks is seen today for follow-up of his hypertension and atrial fibrillation. BP is a little elevated today here in the office.   BP has been ok at home .  Has chronic AF.  Is on Eliquis   Sept . 11 ,2019 Doing well .  No CP   [place]   Sept. 3, 2021  Jeffrey Hicks is seen today for follow up visit for his atrial fib, HTN, hyperlipidemia He was seen last year by virtual visit by Tereso Newcomer, PA  Wife passed away this past 07/26/23 ( dementia related )  Will be selling his place in [place] No CP , no dyspnea Some fatigue    Nov. 2, 2022 Jeffrey Hicks  is seen today for follow up of his ATrial fib, HTN, HLD  [place] [address]  No cp , no dyspnea  Is looking to sell his property in [place].      Jan. 18, 2024  Jeffrey Hicks is sen today for follow up of his atrial fib , HTN, HLD  Still in atrial fib  BP readings are good at home  Has sold all his [place] property.   Jan. 21, 2025 Jeffrey Hicks is seen for follow up of his atrial fib, HTN, HLD  Remains in atrial fib,  completely asymptomatic   His home BP readings are generally lower .      Current Outpatient Medications on File Prior to Visit  Medication Sig Dispense Refill   acetaminophen (TYLENOL) 650 MG CR tablet Take 650 mg by mouth every 8 (eight) hours as needed for pain.     allopurinol (ZYLOPRIM) 100 MG tablet Take 100 mg by mouth daily.      carvedilol (COREG) 12.5 MG tablet Take 1 tablet (12.5 mg total) by mouth 2 (two) times daily. Take an extra 6.25 mg as needed for elevated blood pressure. 180 tablet 3   colchicine 0.6 MG tablet Take 2 tablets by mouth as directed. TAKE TWO TABLETS BY MOUTH AT FIRST SIGN  OF GOUT AND MAY REPEAT DOSE IN ONE HOUR     ELIQUIS 5 MG TABS tablet TAKE ONE TABLET BY MOUTH TWICE DAILY 180 tablet 1   ezetimibe (ZETIA) 10 MG tablet Take 10 mg by mouth daily.     hydrochlorothiazide (HYDRODIURIL) 12.5 MG tablet TAKE ONE TABLET BY MOUTH DAILY 90 tablet 3   Multiple Vitamin (MULTIVITAMIN) tablet Take 1 tablet by mouth daily.       Omega-3 Fatty Acids (FISH OIL PO) Take 1,000 mg by mouth daily.     sildenafil (REVATIO) 20 MG tablet Take 2-5 tablets by mouth one hour prior to sexual activity.     tadalafil (CIALIS) 10 MG tablet Take 10 mg by mouth as needed.     No current facility-administered medications on file prior to visit.    Allergies  Allergen Reactions   Crestor  [Rosuvastatin Calcium] Other (See Comments)    Gout attack   Lipitor  [Atorvastatin] Other (See Comments)    Gout Attack    Past Medical History:  Diagnosis Date   Echocardiogram    EF 60-65, mild MAC, lipomatous interatrial septum, mod LAE, mild RAE, mild MR, mild TR   Hyperlipidemia    Hypertension    Permanent atrial fibrillation (HCC)    CHADS2-VASc=2; Eliquis     Past Surgical History:  Procedure Laterality Date   OTHER SURGICAL HISTORY     no surgical HX    Social History   Tobacco Use  Smoking Status Former   Current packs/day: 0.00   Types: Cigarettes   Quit date: 08/25/2000   Years since quitting: 22.7  Smokeless Tobacco Never    Social History   Substance and Sexual Activity  Alcohol Use No    Family History  Problem Relation Age of Onset   Stroke Mother    Transient ischemic attack Father    Diabetes Brother     Reviw of Systems:  Reviewed in the HPI.  All other systems are negative.    Physical Exam: Blood pressure 132/84, pulse 80, height 5\' 10"  (1.778 m), weight 202 lb 6.4 oz (91.8 kg), SpO2 96%.      GEN:  Well nourished, well  developed in no acute distress HEENT: Normal NECK: No JVD; No carotid bruits LYMPHATICS: No lymphadenopathy CARDIAC:  irreg. Irreg. no murmurs, rubs, gallops RESPIRATORY:  Clear to auscultation without rales, wheezing or rhonchi  ABDOMEN: Soft, non-tender, non-distended MUSCULOSKELETAL:  No edema; No deformity  SKIN: Warm and dry NEUROLOGIC:  Alert and oriented x 3    ECG:     EKG Interpretation Date/Time:  Tuesday May 11 2023 15:26:48 EST Ventricular Rate:  78 PR Interval:    QRS Duration:  78 QT Interval:  380 QTC Calculation: 433 R Axis:   49  Text Interpretation: Atrial fibrillation with a competing junctional pacemaker When compared with ECG of 06-Nov-2018 18:08, PREVIOUS ECG IS PRESENT Confirmed by Kristeen Miss ([id number]) on 05/11/2023 3:49:25 PM         Assessment / Plan:   1. Atrial fibrillation: persistent atrial fib .  On coreg for rate control .  Eliquis 5 BID  Continue current medications  2. Hypertension -   generally well controled.   3. Hyperlipidemia -  stable     1 year with Dr. Mervin Kung, MD  05/11/2023 3:49 PM    [facility] [address] [place],  [address] [place], [place]  [address] Pager [phone] Phone: [phone]; Fax: [phone]

## 2023-05-11 ENCOUNTER — Ambulatory Visit: Payer: Medicare Other | Attending: Cardiovascular Disease | Admitting: Cardiovascular Disease

## 2023-05-11 ENCOUNTER — Encounter: Payer: Self-pay | Admitting: Cardiovascular Disease

## 2023-05-11 VITALS — BP 132/84 | HR 80 | Ht 70.0 in | Wt 202.4 lb

## 2023-05-11 DIAGNOSIS — I10 Essential (primary) hypertension: Secondary | ICD-10-CM

## 2023-05-11 DIAGNOSIS — I4821 Permanent atrial fibrillation: Secondary | ICD-10-CM | POA: Diagnosis not present

## 2023-05-11 DIAGNOSIS — E782 Mixed hyperlipidemia: Secondary | ICD-10-CM | POA: Diagnosis not present

## 2023-05-11 NOTE — Patient Instructions (Signed)
Follow-Up: At [facility], you and your health needs are our priority.  As part of our continuing mission to provide you with exceptional heart care, we have created designated Provider Care Teams.  These Care Teams include your primary Cardiologist (physician) and Advanced Practice Providers (APPs -  Physician Assistants and Nurse Practitioners) who all work together to provide you with the care you need, when you need it.  Your next appointment:   1 year(s)  Provider:   Olga Millers, MD     1st Floor: - Lobby - Registration  - Pharmacy  - Lab - Cafe  2nd Floor: - PV Lab - Diagnostic Testing (echo, CT, nuclear med)  3rd Floor: - Vacant  4th Floor: - TCTS (cardiothoracic surgery) - AFib Clinic - Structural Heart Clinic - Vascular Surgery  - Vascular Ultrasound  5th Floor: - [facility] (general and EP) - Clinical Pharmacy for coumadin, hypertension, lipid, weight-loss medications, and med management appointments    Valet parking services will be available as well.

## 2024-04-27 ENCOUNTER — Encounter: Payer: Self-pay | Admitting: Cardiology
